# Patient Record
Sex: Female | Born: 1985 | Race: White | Hispanic: No | Marital: Single | Smoking: Never smoker
Health system: Southern US, Community
[De-identification: ages and names within clinical notes are randomized; demographics above are authoritative.]

## PROBLEM LIST (undated history)

## (undated) DIAGNOSIS — G809 Cerebral palsy, unspecified: Secondary | ICD-10-CM

## (undated) DIAGNOSIS — M62838 Other muscle spasm: Secondary | ICD-10-CM

## (undated) DIAGNOSIS — G8250 Quadriplegia, unspecified: Secondary | ICD-10-CM

## (undated) DIAGNOSIS — G709 Myoneural disorder, unspecified: Secondary | ICD-10-CM

## (undated) DIAGNOSIS — F32A Depression, unspecified: Secondary | ICD-10-CM

## (undated) HISTORY — PX: BACLOFEN PUMP IMPLANTATION: SHX330

## (undated) HISTORY — DX: Depression, unspecified: F32.A

## (undated) HISTORY — DX: Other muscle spasm: M62.838

## (undated) HISTORY — PX: BLADDER SURGERY: SHX569

## (undated) HISTORY — DX: Cerebral palsy, unspecified: G80.9

## (undated) HISTORY — DX: Myoneural disorder, unspecified: G70.9

## (undated) HISTORY — PX: CLOSURE, WOUND, FLAP: SHX3461

## (undated) HISTORY — DX: Quadriplegia, unspecified: G82.50

## (undated) HISTORY — PX: HIP SURGERY: SHX245

---
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
## 2003-06-11 ENCOUNTER — Ambulatory Visit (INDEPENDENT_AMBULATORY_CARE_PROVIDER_SITE_OTHER)
Admit: 2003-06-11 | Disposition: A | Discharge status: Home / Self Care | Payer: Self-pay | Source: Ambulatory Visit | Admitting: Neurological Surgery

## 2003-06-12 ENCOUNTER — Ambulatory Visit: Admission: AD | Admit: 2003-06-12 | Payer: Self-pay | Source: Ambulatory Visit | Admitting: Neurological Surgery

## 2003-08-19 ENCOUNTER — Ambulatory Visit
Admit: 2003-08-19 | Disposition: A | Discharge status: Home / Self Care | Payer: Self-pay | Source: Ambulatory Visit | Admitting: Neurological Surgery

## 2003-08-25 ENCOUNTER — Ambulatory Visit (INDEPENDENT_AMBULATORY_CARE_PROVIDER_SITE_OTHER)
Admit: 2003-08-25 | Disposition: A | Discharge status: Home / Self Care | Payer: Self-pay | Source: Ambulatory Visit | Admitting: Neurological Surgery

## 2003-08-25 ENCOUNTER — Ambulatory Visit
Admit: 2003-08-25 | Disposition: A | Discharge status: Home / Self Care | Payer: Self-pay | Source: Ambulatory Visit | Admitting: Neurological Surgery

## 2003-10-27 ENCOUNTER — Ambulatory Visit (INDEPENDENT_AMBULATORY_CARE_PROVIDER_SITE_OTHER)
Admit: 2003-10-27 | Disposition: A | Discharge status: Home / Self Care | Payer: Self-pay | Source: Ambulatory Visit | Admitting: Neurological Surgery

## 2004-01-20 ENCOUNTER — Ambulatory Visit (INDEPENDENT_AMBULATORY_CARE_PROVIDER_SITE_OTHER)
Admit: 2004-01-20 | Disposition: A | Discharge status: Home / Self Care | Payer: Self-pay | Source: Ambulatory Visit | Admitting: Neurological Surgery

## 2004-12-27 ENCOUNTER — Ambulatory Visit (INDEPENDENT_AMBULATORY_CARE_PROVIDER_SITE_OTHER)
Admit: 2004-12-27 | Disposition: A | Discharge status: Home / Self Care | Payer: Self-pay | Source: Ambulatory Visit | Admitting: Neurological Surgery

## 2009-08-17 ENCOUNTER — Ambulatory Visit
Admit: 2009-08-17 | Disposition: A | Discharge status: Home / Self Care | Payer: Self-pay | Source: Ambulatory Visit | Admitting: Undersea and Hyperbaric Medicine

## 2009-08-24 ENCOUNTER — Ambulatory Visit
Admit: 2009-08-24 | Disposition: A | Discharge status: Home / Self Care | Payer: Self-pay | Source: Ambulatory Visit | Admitting: Undersea and Hyperbaric Medicine

## 2009-09-02 ENCOUNTER — Ambulatory Visit
Admit: 2009-09-02 | Disposition: A | Discharge status: Home / Self Care | Payer: Self-pay | Source: Ambulatory Visit | Admitting: Undersea and Hyperbaric Medicine

## 2009-09-07 ENCOUNTER — Ambulatory Visit
Admit: 2009-09-07 | Disposition: A | Discharge status: Home / Self Care | Payer: Self-pay | Source: Ambulatory Visit | Admitting: Undersea and Hyperbaric Medicine

## 2009-09-14 ENCOUNTER — Ambulatory Visit
Admit: 2009-09-14 | Disposition: A | Discharge status: Home / Self Care | Payer: Self-pay | Source: Ambulatory Visit | Admitting: Undersea and Hyperbaric Medicine

## 2009-09-16 ENCOUNTER — Ambulatory Visit
Admit: 2009-09-16 | Disposition: A | Discharge status: Home / Self Care | Payer: Self-pay | Source: Ambulatory Visit | Admitting: Undersea and Hyperbaric Medicine

## 2009-09-28 ENCOUNTER — Ambulatory Visit
Admit: 2009-09-28 | Disposition: A | Discharge status: Home / Self Care | Payer: Self-pay | Source: Ambulatory Visit | Admitting: Undersea and Hyperbaric Medicine

## 2009-10-19 ENCOUNTER — Ambulatory Visit
Admit: 2009-10-19 | Disposition: A | Discharge status: Home / Self Care | Payer: Self-pay | Source: Ambulatory Visit | Admitting: Undersea and Hyperbaric Medicine

## 2009-11-09 ENCOUNTER — Ambulatory Visit
Admit: 2009-11-09 | Disposition: A | Discharge status: Home / Self Care | Payer: Self-pay | Source: Ambulatory Visit | Admitting: Undersea and Hyperbaric Medicine

## 2010-01-04 ENCOUNTER — Ambulatory Visit
Admit: 2010-01-04 | Disposition: A | Discharge status: Home / Self Care | Payer: Self-pay | Source: Ambulatory Visit | Admitting: Undersea and Hyperbaric Medicine

## 2010-01-26 ENCOUNTER — Ambulatory Visit
Admit: 2010-01-26 | Disposition: A | Discharge status: Home / Self Care | Payer: Self-pay | Source: Ambulatory Visit | Admitting: Undersea and Hyperbaric Medicine

## 2010-11-12 IMAGING — CR DG TOE GREAT 2+V*L*
3 series · 3 of 3 positions shown · non-contrast
Comparison: 03/24/2005

CLINICAL DATA: Left foot pain - question osteomyelitis of the left
great toe

LEFT TOE - 2+ VIEW

[w toes lateral left *]
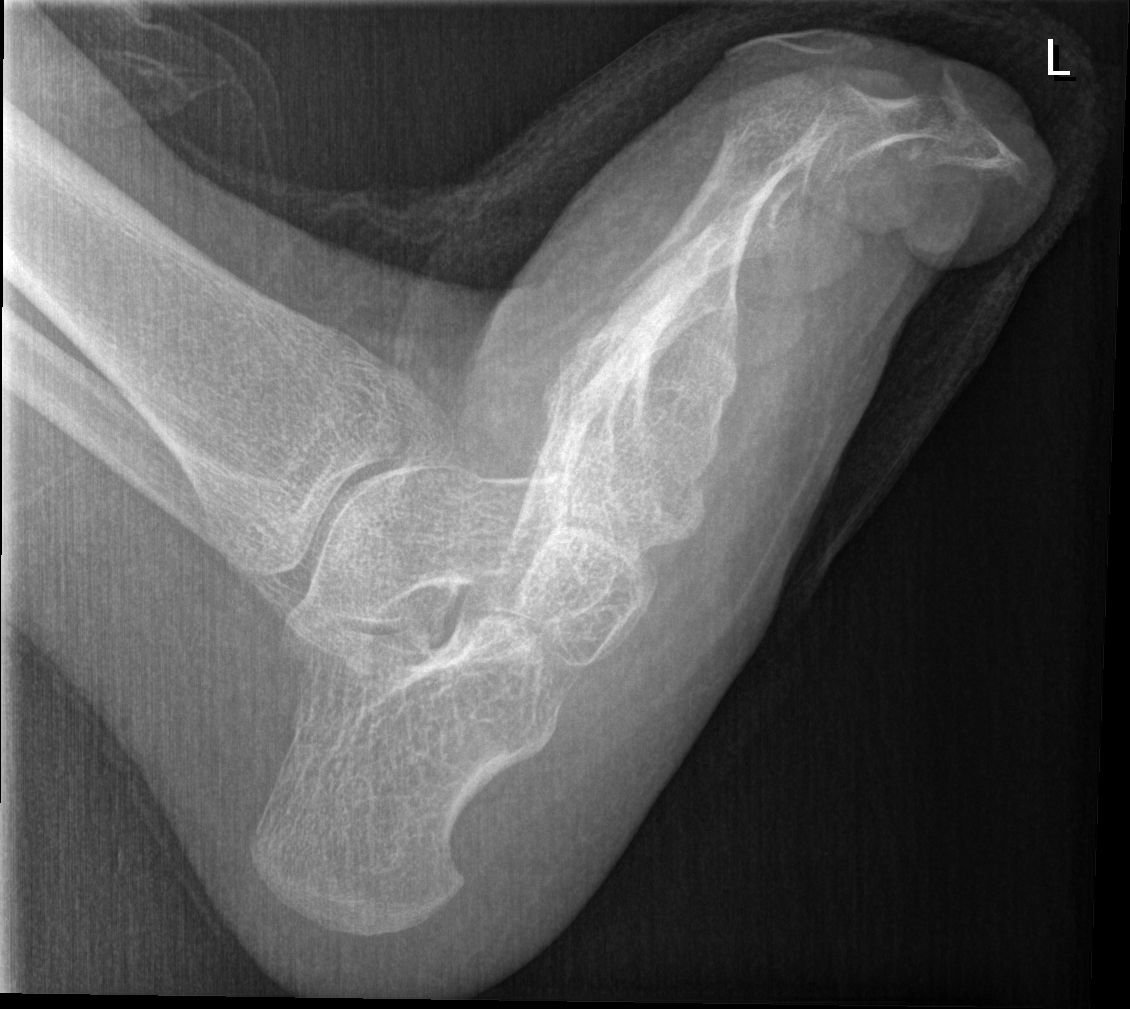

[view not recorded (1 of 2)]
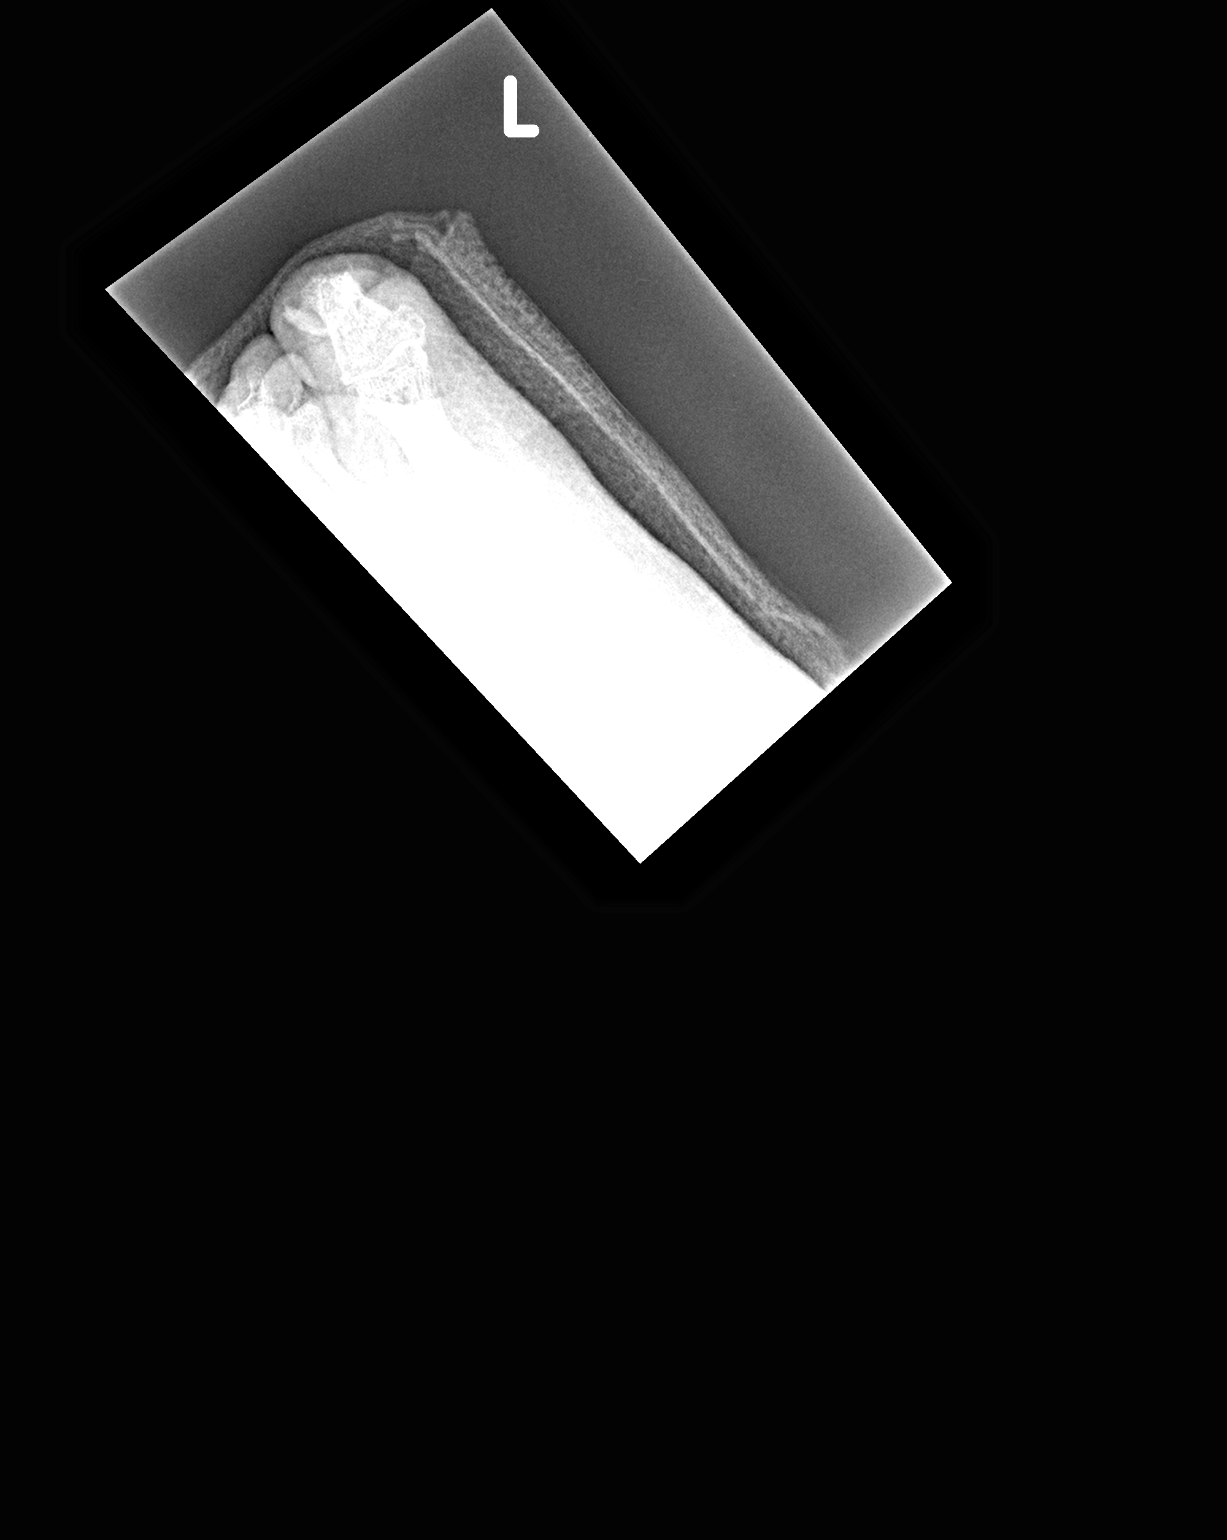

[view not recorded (2 of 2)]
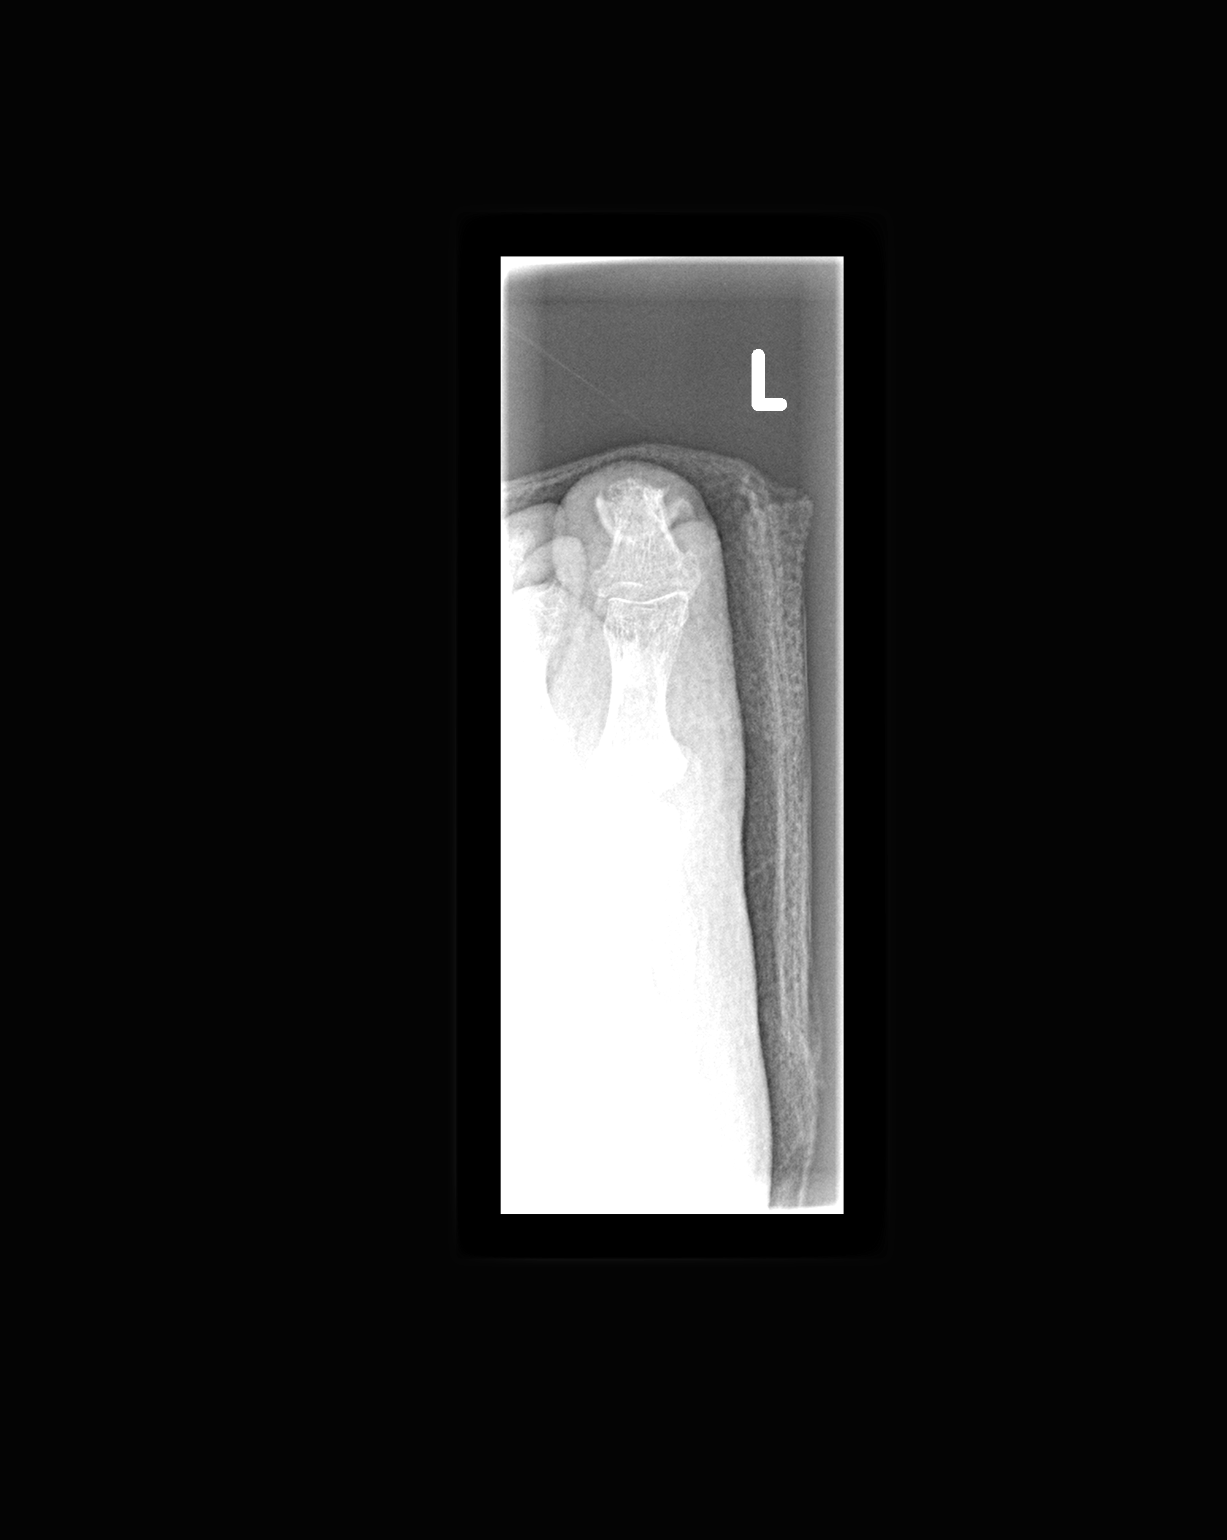

[3 of 3 positions shown; findings below may reference images not displayed]

FINDINGS: Bony detail is somewhat obscured by the bandaging
material.  Nonetheless, in the AP and oblique projections, there
does appear to be at least some degree of osteolysis involving the
terminal tuft of the great toe, compared to prior studies.  This is
suggestive of osteomyelitis.
IMPRESSION: Probable osteomyelitis involving the terminal tuft of the great
toe.

## 2011-02-22 ENCOUNTER — Encounter (INDEPENDENT_AMBULATORY_CARE_PROVIDER_SITE_OTHER): Payer: Self-pay

## 2011-02-22 ENCOUNTER — Ambulatory Visit (INDEPENDENT_AMBULATORY_CARE_PROVIDER_SITE_OTHER): Payer: Medicare Other | Admitting: Family Medicine

## 2011-02-22 DIAGNOSIS — N390 Urinary tract infection, site not specified: Secondary | ICD-10-CM

## 2011-03-01 ENCOUNTER — Ambulatory Visit: Admit: 2011-03-01 | Payer: Self-pay | Source: Ambulatory Visit

## 2011-03-21 ENCOUNTER — Ambulatory Visit (INDEPENDENT_AMBULATORY_CARE_PROVIDER_SITE_OTHER): Payer: Medicare Other

## 2011-03-21 DIAGNOSIS — N390 Urinary tract infection, site not specified: Secondary | ICD-10-CM

## 2011-03-21 DIAGNOSIS — R300 Dysuria: Secondary | ICD-10-CM

## 2011-03-21 LAB — POCT URINALYSIS DIPSTIX (10)(MULTI-TEST)
Bilirubin, UA POCT: NEGATIVE
Blood, UA POCT: NEGATIVE
Glucose, UA POCT: NEGATIVE mg/dL
Ketones, UA POCT: NEGATIVE mg/dL
Nitrite, UA POCT: POSITIVE — AB
POCT Spec Gravity, UA: 1.015 (ref 1.001–1.035)
POCT pH, UA: 6.5 (ref 5–8)
Protein, UA POCT: NEGATIVE mg/dL
Urobilinogen, UA: 0.2 mg/dL

## 2011-04-07 NOTE — Progress Notes (Unsigned)
Account Number: [id number]    *** PRELIMINARY DRAFT ***      Document ID: [id number]      Admit Date: 09/16/2009      Service Date: 09/16/2009            Patient Location: DISCHARGED 09/17/2009      Patient Type: O            PHYSICIAN/PROVIDER: Gwenyth Allegra MD                  HISTORY OF PRESENT ILLNESS:      The patient is a 25 year old woman who returns in followup for her left      first metatarsal head nonhealing wound.  She had a graft placed on August 24, 2009.  She returns today in followup with no complaints.            Since her last visit on September 07, 2009, she has had no change in      medications.  She has had no hospitalizations.  She has had no change to      her review of systems.            PHYSICAL EXAMINATION:      GENERAL:  The patient is a thin woman in no acute distress.      VITAL SIGNS:  Temperature is 96.6, pulse is 82, blood pressure is 104/66.      She rates her pain as a 0/10.      SKIN:  Her wound measures 0.2 cm in diameter.  There is a stable eschar at      the wound site.  There is no erythema or drainage.            ASSESSMENT AND PLAN:      I suspect the patient's eschar will slough within the next week or two.      Overall, she looks good and her measurements continue to decrease.  She      will follow up next week.            Please see the daily wound assessment and treatment form dated September 16, 2009, for further documentation of patient education and training as well      as her final status and destination.                        Electronic Signing Provider      _______________________________     Date/Time Signed: _____________      Gwenyth Allegra MD [phone])            D:  09/16/2009 13:55 PM by Dr. Gwenyth Allegra, MD [phone])      T:  09/18/2009 21:04 PM by [id number]          Everlean Cherry: [id number]) (Doc ID: [id number])                  NG:EXBM Zenaida Tesar MD

## 2011-04-07 NOTE — Progress Notes (Signed)
[name], [name]      MRN:          [id number]      Account:      [id number]      Document ID:  [id number] [id number] [id number]      Service Date: 09/02/2009            Admit Date: 09/02/2009            Patient Location: DISCHARGED 09/03/2009      Patient Type: O            PHYSICIAN/PROVIDER: Gwenyth Allegra MD                  LOCATION:      [facility]            HISTORY OF PRESENT ILLNESS:      The patient is a 25 year old woman who returns in followup for her      nonhealing left first metatarsal head dorsal wound.  She had an allograft      placed on August 24, 2009.  She returns today in followup.            Since her last visit on August 24, 2009, she has had no change in      medications.  She has had no hospitalizations.  She has had no change to      her review of systems.            PHYSICAL EXAMINATION:      VITAL SIGNS:  Today, temperature is 96.4, pulse is 80, blood pressure is      106/63.  She rates her pain as a 0/10.      SKIN:  Her allograft is adherent.  There is no erythema or drainage.  There      is no foul odor.            ASSESSMENT AND PLAN:      Overall, the patient is doing well a little over a week after her      allograft.  She will continue with a Xeroform dressing.  She will follow up      next week with Dr. Deno Lunger.            Please see the daily wound assessment and treatment form dated September 02, 2009, for further documentation of patient education and training as well      as her final status and destination.                        Electronic Signing Provider            D:  09/02/2009 19:16 PM by Dr. Gwenyth Allegra, MD [phone])      T:  09/04/2009 14:52 PM by [id number]                  SA:YTKZ Anthon Harpole MD                                   Page 1 of 1      Authenticated by Gwenyth Allegra, MD On 09/08/2009 11:41:15 AM

## 2011-04-07 NOTE — Progress Notes (Signed)
[name] [name], [name] [name]      MRN:          [id number]      Account:      [id number]      Document ID:  [id number] [phone]      Service Date: 08/17/2009            Admit Date: 08/17/2009            Patient Location: DISCHARGED 08/18/2009      Patient Type: Val Eagle            PHYSICIAN/PROVIDER: Marti Sleigh DO                  LOCATION:      [facility].            HISTORY OF PRESENT ILLNESS:      This is a 25 year old white female with a history of cerebral palsy who has      had a wound over the dorsum of her left first metatarsal head for almost 5      years.  She relates it started as a blister when she was wearing sandals in      2006 and developed into a nonhealing ulcer.  She was treated at [facility] in [facility] and was diagnosed having MRSA in      April 2010 treated with antibiotics.  Has been on multiple silver      preparations as wound treatment.  Has had periodic sharp debridement.  Most      recently has been applying gentamicin ointment to the wound daily.  Of      note, is that she sleeps on her abdomen and possibly causes additional      pressure to the wound because of her sleeping position.            PAST MEDICAL HISTORY:      Cerebral palsy, migraines, urinary incontinence.            PAST SURGICAL HISTORY:      Bladder augmentation with continent urinary diversion.            SOCIAL HISTORY:      She does not drink alcohol.  She does not smoke.  She is currently      unemployed, looking for work.            FAMILY HISTORY:      Her father had diabetes.  Her mother has high blood pressure.            REVIEW OF SYSTEMS:      As noted above.  She also has spasms secondary to cerebral palsy, and she      has had swelling in her lower extremities periodically.  Her feet are      always somewhat cyanotic and cold.  A full systems review did not reveal      any additional information.            DRUG ALLERGIES:      None known.                                          Page 1 of 3      BARABARA, MOTZ      MRN:  [id number]      Account:      [id number]      Document ID:  [id number]      Service Date: 08/17/2009            MEDICATIONS:      Neurontin 900 mg twice daily, Topamax 50 mg daily, Ditropan 10 mg b.i.d.,      Prozac 20 mg daily, Macrodantin 50 mg daily, Lyrica 75 mg daily, Colace 200      mg daily, Align (probiotic) once daily, multivitamin daily, vitamin D 2000      daily, baclofen SQ with implantable pump.            A nutrition screen is scored at 9, which puts her at high risk for poor      wound healing.            PHYSICAL EXAMINATION:      GENERAL:  She is alert and oriented, in no acute distress.  She denies any      pain.      VITAL SIGNS:  Temperature is 97.2, blood pressure 132/71, pulse 88.      EXTREMITIES:  On exam of her feet, her right foot appears somewhat      edematous and deformed.  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  The pulses are intact.  On      her left foot, she has onychomycosis of her toes.  The toenails were      trimmed today.  Her foot is cold to the touch but pulses are intact.  She      has a superficial ulcer over her left first metatarsal head measuring 0.5 x      0.4 and 0.2-cm deep.  If this was classified as a pressure ulcer, it would      be a stage II.  There is a moderate amount of fibrin in the wound.  There      is firm, red granulation tissue present.  There is no exposed bone or      tendon, no eschar or odor.  There is a small amount of yellowish discharge.       Periwound area was intact.  The wound was anesthetized with 2% topical      lidocaine, excisionally debrided to the subcutaneous tissue level with a      sharp curette, causing a minimal amount of bleeding.  Debridement was done      to remove all necrotic debris, fibrin, and decrease the bacterial load.  A      second curette was used to obtain a wound culture.  The wound was  dressed      with Acticoat 7 Flex and Kling dressing.  An offloading foam pad was placed in      the wound.            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  She has been wearing soft      fluffy house shoes to help prevent pressure and further problems.            PLAN:  1.  A wound culture is pending.      2.  The patient would benefit from an allograft to assist with wound      healing with the wound having persisted almost 5 years, coming this May      2011.  It is necessary to use advanced wound healing products to heal this      wound expeditiously.      3.  The patient was advised of the need to offload her wound and to follow      up in 1 week for further care.                                   Page 2 of 3      JAMILAH, JEAN      MRN:          [id number]      Account:      [id number]      Document ID:  [id number] [phone]      Service Date: 08/17/2009                              Electronic Signing Provider            D:  08/17/2009 13:48 PM by Dr. Marti Sleigh, DO [phone])      T:  08/18/2009 16:17 PM by [id number]                  cc:     Andris Flurry DO                                   Page 3 of 3      Authenticated and Edited by Marti Sleigh, DO On 08/26/09 10:01:39 AM

## 2011-04-07 NOTE — Progress Notes (Signed)
[name] [name], [name] [name]      MRN:          [id number]      Account:      [id number]      Document ID:  [id number]      Service Date: 09/28/2009            Admit Date: 09/28/2009            Patient Location: DISCHARGED 09/29/2009      Patient Type: Val Eagle            PHYSICIAN/PROVIDER: Marti Sleigh DO                  LOCATION:      [facility].            HISTORY OF PRESENT ILLNESS:      This is a 25 year old woman who is returning for care of a pressure ulcer      over the dorsum of her left first metatarsal.            PHYSICAL EXAMINATION:      Her temperature is 96.5.  She denies any pain.  Her wound today is covered      with a firmly adherent eschar measuring 0.3 x 0.3.  The eschar was left      intact.  A black foam offloading dressing was placed over the wound and      held in place with cotton and Coban.            PLAN:      The patient tolerated procedure well and will return in 2 weeks for      followup care.                        Electronic Signing Provider            D:  09/28/2009 11:42 AM by Dr. Marti Sleigh, DO [phone])      T:  09/28/2009 14:54 PM by [id number]                  YQ:[email or web address] Deno Lunger DO                                   Page 1 of 1      Authenticated by Marti Sleigh, DO On 09/30/2009 10:36:03 AM

## 2011-04-07 NOTE — Progress Notes (Signed)
[name] [name], [name] [name]      MRN:          [id number]      Account:      [id number]      Document ID:  [id number] [phone]      Service Date: 08/24/2009            Admit Date: 08/24/2009            Patient Location: DISCHARGED 08/25/2009      Patient Type: Val Eagle            PHYSICIAN/PROVIDER: Marti Sleigh DO                  LOCATION:      [facility].            HISTORY OF PRESENT ILLNESS:      This is a 25 year old white female who has cerebral palsy and has had a      chronic wound over the dorsum of her left first metatarsal head for      approximately 5 years.  She has had advanced wound healing at her previous      wound clinic.  Has been seen here last week when a wound culture was done.      The culture and showed no growth and the patient is returning today for      further advanced wound healing treatment.            PHYSICAL EXAMINATION:      VITAL SIGNS:  Temperature is 96.1.  She denies any pain.      SKIN:  She has a wound over her left first metatarsal head measuring 0.5 cm      x 0.3 cm and 0.2 cm deep.  There is undermining of 0.1 cm.  All around the      wound.  There is mild amount of fibrin over the wound with firm, pink      granulation tissue present.  There is no exposed bone or tendon.  No eschar      or odor.  There was a small amount of yellowish-pink drainage.  The      periwound area was intact.            The wound was anesthetized with 2% topical lidocaine, excisionally debrided      with a sharp curette to the subcutaneous tissue level.  No bleeding      occurred.  It was further prepped for allograft placement in the usual      manner.  After prepping and draping the wound, a frozen allograft that was      thawed according to protocol was measured and cut to fit over the wound.      It was held in place with Alcare and sterile tape closures, covered with      Xeroform gauze and a calcium alginate dressing was placed above it.  The      patient tolerated the procedure well.             IMPRESSION:      Wound on dorsal aspect of the left first metatarsal for 5 years, now      treated with an allograft.            PLAN:      The patient is to continue offloading her foot as much as possible and      return in 1 week for  further care.                                         Page 1 of 2      [name] [name], [name] [name]      MRN:          [id number]      Account:      [id number]      Document ID:  [id number]      Service Date: 08/24/2009                        Electronic Signing Provider            D:  08/24/2009 13:52 PM by Dr. Marti Sleigh, DO [phone])      T:  08/24/2009 23:55 PM by [id number]                  NG:[email or web address] Deno Lunger DO                                   Page 2 of 2      Authenticated by Marti Sleigh, DO On 08/26/2009 10:00:21 AM

## 2011-04-07 NOTE — Progress Notes (Signed)
MONAY, HOULTON      MRN:          [id number]      Account:      [id number]      Document ID:  [id number]      Service Date: 01/26/2010            Admit Date: 01/26/2010            Patient Location: DISCHARGED 01/27/2010      Patient Type: O            PHYSICIAN/PROVIDER: Hiram Gash DPM                  REFERRING PHYSICIAN:      Dr. Andris Flurry.            REASON FOR REFERRAL:      Ulceration to the first MPJ, left foot.            HISTORY OF PRESENT ILLNESS:      This is a 25 year old female with past medical history of cerebral palsy,      paraplegia, migraines, urinary incontinence, who presents to [facility] wound healing center for evaluation of ulceration on the      first MPJ, left foot.  Patient is nonweightbearing secondary to paraplegia      from cerebral palsy.  Patient has developed an ulceration to her first MPJ      left foot secondary to shoe wear.  Mother is with patient and reports that      she has been battling ulcerations to her left foot for the past 6 years,      ever since they have been actively applying shoes to the patient.  Mother      reports that they are interested in forms of shoewear that can be more      accommodating to the patient so ulcerations wouldn't develop.  Patient reports      no nausea, vomiting, fever or chills.            PAST MEDICAL HISTORY:      As above.            PAST SURGICAL HISTORY:      Bladder augmentation.            MEDICATIONS:      Neurontin 900 mg twice daily, Topamax 50 mg once daily, Ditropan 10 mg      twice daily, Prozac 20 mg once daily, Macrodantin 50 mg once daily, Lyrica      75 mg daily, Colace 200 mg once daily, Align once daily, multivitamin once      daily, vitamin D 200 international units daily.            ALLERGIES:      No known drug allergies.            SOCIAL HISTORY:      Negative alcohol, negative tobacco.                                         Page 1 of 3      RANYA, FIDDLER      MRN:           [id number]      Account:      [id number]      Document  ID:  [id number]      Service Date: 01/26/2010            REVIEW OF SYSTEMS:      Positive neurologic with cerebral palsy.  Positive for renal and      genitourinary with bladder augmentation, positive musculoskeletal with      paraplegia.            PHYSICAL EXAMINATION:      VITAL SIGNS:  Current temperature 97.5, pulse 87, blood pressure 129/74.      LOWER EXTREMITY PHYSICAL EXAM:      VASCULAR: Palpable pedal pulses.      NEUROLOGIC: Positive sensation of bilateral feet.      DERMATOLOGIC:  The patient      has an eschar overlying the dorsal medial first MPJ of the left foot measuring      0.7 cm in length x 1 cm in width with no erthema or drainage. Small eschar to      distal tuft left Hallux with no erythema or drainage.      ORTHOPEDIC: The left      hallux is in the plantarflex position secondary to semirigid contracture.            ASSESSMENT:      1.  Ulcer, left foot.      2.  Plantarflexed hallux, left foot      3.  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            PLAN:      Informed mother and patient that she has no acute signs of infection to the      left foot.  Informed mother and patient that the reason for the chronic      ulcerations left Hallux are due to the hallux contracture; therefore, whenever      any shoe is placed on her foot, it is going to cause friction and eventual      ulceration. Informed mother and patient that one way to alleviate the chronic      ulcers is for patient not to wear closed toe shoes, but mother and patient      would prefer shoewear.  I recommended surgical intervention. I explained to the      mother and patient that there are 2 procedures that can be done, one being a      flexor tenotomy to flexor hallucis longus as a simple procedure, and the other      would be a first MPJ fusion.  Informed mother and patient that a first MPJ      fusion would require about 6 weeks minimum for bone healing and  potential      complications are increased such as infection, delayed wound healing, nonunion      or delayed union, and malposition. Informed mother and patient that with flexor      tenotomy there are minimal complications and it would allow for the flexor      contracture to the left hallux to be resolved.  Informed patient that there is      a chance that in the future the flexor contracture could come back, but not as      bad as it is now.            After discussion of possible procedures, the mother and patient seem to      favor a flexor tenotomy to be done in the future.  I  informed patient that      they can seek a second opinion in reference to surgical intervention to the      left foot.  Informed mother and patient that in the meantime, I will      recommend that she does not wear any shoes and that she wear socks which      can allow for the eschar that she has to her left foot to resolve.      Informed patient that they can follow up as needed for any further issues                                   Page 2 of 3      DARTHA, ROZZELL      MRN:          [id number]      Account:      [id number]      Document ID:  [id number]      Service Date: 01/26/2010            to the left foot.                                    D:  02/09/2010 09:07 AM by Dr. Miguel Dibble. Alanda Slim, DPM [phone])      T:  02/09/2010 10:16 AM by [id number]                  cc:                                   Page 3 of 3      Authenticated and Edited by Hiram Gash, DPM On 02/09/10 1:00:54 PM

## 2011-04-07 NOTE — Progress Notes (Signed)
[name] [name], [name] [name]      MRN:          [id number]      Account:      [id number]      Document ID:  [id number]      Service Date: 10/19/2009            Admit Date: 10/19/2009            Patient Location: DISCHARGED 10/20/2009      Patient Type: Val Eagle            PHYSICIAN/PROVIDER: Marti Sleigh DO                  LOCATION:      [facility].            HISTORY OF PRESENT ILLNESS:      This is a 25 year old white female who has cerebral palsy and has a      pressure ulcer over her left first metatarsal head.  She denies any pain.            PHYSICAL EXAMINATION:      VITAL SIGNS:  Temperature is 96.3.  On exam, there is a small firmly      adherent eschar over the wound site.  It was not easily removed with blunt      debridements, so was left intact.  A dry dressing and offloading foam was      placed over the wound area.  The patient tolerated the procedure well.            IMPRESSION:      Pressure ulcer over the dorsum of left first metatarsal head covered with      eschar.            PLAN:      To leave the eschar intact spontaneously falls off.  We are hoping for intact       skin and a healed wound after escars falls off.                        Electronic Signing Provider            D:  10/19/2009 13:55 PM by Dr. Marti Sleigh, DO [phone])      T:  10/19/2009 18:37 PM by [id number]                  cc:     Andris Flurry DO                                   Page 1 of 1      Authenticated and Edited by Marti Sleigh, DO On 10/21/09 10:29:13 AM

## 2011-04-07 NOTE — Progress Notes (Signed)
[name], [name]      MRN:          [id number]      Account:      [id number]      Document ID:  [id number]      Service Date: 11/09/2009            Admit Date: 11/09/2009            Patient Location: DISCHARGED 11/10/2009      Patient Type: O            PHYSICIAN/PROVIDER: Marti Sleigh DO                  HISTORY OF PRESENT ILLNESS:      This is a 25 year old woman who is following up for a pressure ulcer over      her dorsal aspect of her left first metatarsal head.            PHYSICAL EXAMINATION:      VITAL SIGNS:  Temperature is 96.5.  She denies any pain.      EXTREMITIES:  There is an eschar firmly adherent over the wound site.            DESCRIPTION OF PROCEDURE:      The eschar was bluntly removed. No excisional debridement was done.  It was      then redressed with an offloading foam to prevent pressure to the wound and      reduce the chances of recurrence of a wound over that site.  Kling and a      Coban wrap were used to keep the offloading foam in place.            IMPRESSION:      Pressure ulcer over left first metatarsal head, now healed.            PLAN:      The patient tolerated the procedure well.  She was instructed to prevent      trauma to the wound area and prevent any pressure.  She will be wearing      offloading foam over the site.  She has plans to obtain special shoes from      a podiatrist to allow for better offloading of the area.  She would like to      return in 4 weeks for a reevaluation.                        Electronic Signing Provider            D:  11/11/2009 16:00 PM by Dr. Marti Sleigh, DO [phone])      T:  11/13/2009 17:09 PM by [id number]                  cc:                                   Page 1 of 1      Authenticated by Marti Sleigh, DO On 11/16/2009 09:04:44 AM

## 2011-04-07 NOTE — Progress Notes (Signed)
[name] [name], [name] [name]      MRN:          [id number]      Account:      [id number]      Document ID:  [id number]      Service Date: 09/07/2009            Admit Date: 09/07/2009            Patient Location: DISCHARGED 09/08/2009      Patient Type: O            PHYSICIAN/PROVIDER: Gwenlyn Fudge MD                  LOCATION:      [facility]            HISTORY OF PRESENT ILLNESS:      The patient is a 25 year old with a left foot wound who returns for      regularly scheduled evaluation with her mother.  There are no problems to      relate today.            PHYSICAL EXAMINATION:      VITAL SIGNS:  Temperature is 95, pulse 72, blood pressure 113/72.      EXTREMITIES:  She has a small wound over the dorsum of first metatarsal      head measuring 0.3 x 0.3 cm.  There is a small fragment of adherent      allograft.  There is firm, red granulation tissue at the base of the wound.       There is slightly macerated periwound skin.            DESCRIPTION OF PROCEDURE:      After topical 2% was applied to the wound, skin and subcutaneous tissue      debridement of the small wound was performed with a sharp curette.      Bleeding was controlled with digital pressure.  Patient tolerated this      well.            IMPRESSION:      The patient is a 25 year old with a small wound over the dorsum of her      first metatarsal head.  This was dressed with Oasis, Promogran, and covered      with Restore AG and topical foam.  She is to return in a week or earlier      should any problems arise.                        Electronic Signing Provider            D:  09/07/2009 11:27 AM by Dr. Ocie Bob. Margo Aye, MD ([id number])      T:  09/08/2009 12:31 PM by [id number]                                                     Page 1 of 1      Authenticated and Edited by Gwenlyn Fudge, MD On 09/10/09 8:46:46 AM

## 2011-04-07 NOTE — Progress Notes (Signed)
CALEA, HRIBAR      MRN:          [id number]      Account:      [id number]      Document ID:  [id number]      Service Date: 01/04/2010            Admit Date: 01/04/2010            Patient Location: DISCHARGED 01/05/2010      Patient Type: Val Eagle            PHYSICIAN/PROVIDER: Marti Sleigh DO                  LOCATION:      [facility].            HISTORY OF PRESENT ILLNESS:      This is a 25 year old woman with cerebral palsy who is returning for a      wound over her dorsal left first metatarsal head.  She has had pressure      ulcers in the site before, especially since she sleeps on her abdomen.  She      has been using offloading foam over the wound site.            PHYSICAL EXAMINATION:      VITAL SIGNS:  Temperature is 97.7.  She denies any pain.      SKIN:  She has an eschar measuring 0.9 x 0.8 cm over the left first      metatarsal head.  The eschar was moistened with 2% topical lidocaine and      excisionally debrided using a sharp curette to the partial thickness level.       No bleeding occurred.  Following debridement of the eschar, the wound site      measured 0.1 cm x 0.1 cm and 0.1 cm deep, which is a very small punctate      lesion secondary to pressure.            IMPRESSION:      Pressure ulcer, stage II, of left great toe.            PLAN:      The patient was prescribed Aquacel AG to place over the wound and to change      every 2 to 3 days.  She was rewrapped with offloading foam, Coban and      cotton wrap.  The patient will follow up with a podiatrist, Dr. Alanda Slim,      next week.                        Electronic Signing Provider            D:  01/04/2010 15:50 PM by Dr. Marti Sleigh, DO [phone])      T:  01/05/2010 15:48 PM by [id number]                  cc:                                   Page 1 of 1      Authenticated by Marti Sleigh, DO On 01/06/2010 09:58:48 AM

## 2011-05-09 ENCOUNTER — Ambulatory Visit
Admit: 2011-05-09 | Discharge: 2011-05-09 | Disposition: A | Discharge status: Home / Self Care | Payer: Self-pay | Source: Ambulatory Visit

## 2011-05-23 ENCOUNTER — Ambulatory Visit
Admit: 2011-05-23 | Discharge: 2011-05-23 | Disposition: A | Discharge status: Home / Self Care | Payer: Self-pay | Source: Ambulatory Visit

## 2011-05-23 LAB — CBC AND DIFFERENTIAL
Basophils Absolute Automated: 0.03 10*3/uL (ref 0.00–0.20)
Basophils Automated: 1 % (ref 0–2)
Eosinophils Absolute Automated: 0.1 10*3/uL (ref 0.00–0.70)
Eosinophils Automated: 2 % (ref 0–5)
Hematocrit: 41.2 % (ref 37.0–47.0)
Hgb: 13.7 g/dL (ref 12.0–16.0)
Immature Granulocytes Absolute: 0 10*3/uL
Immature Granulocytes: 0 % (ref 0–1)
Lymphocytes Absolute Automated: 1.93 10*3/uL (ref 0.50–4.40)
Lymphocytes Automated: 42 % — ABNORMAL HIGH (ref 15–41)
MCH: 30 pg (ref 28.0–32.0)
MCHC: 33.3 g/dL (ref 32.0–36.0)
MCV: 90.4 fL (ref 80.0–100.0)
MPV: 11 fL (ref 9.4–12.3)
Monocytes Absolute Automated: 0.24 10*3/uL (ref 0.00–1.20)
Monocytes: 5 % (ref 0–11)
Neutrophils Absolute: 2.31 10*3/uL (ref 1.80–8.10)
Neutrophils: 50 % — ABNORMAL LOW (ref 52–75)
Nucleated RBC: 0 /100 WBC
Platelets: 261 10*3/uL (ref 140–400)
RBC: 4.56 10*6/uL (ref 4.20–5.40)
RDW: 14 % (ref 12–15)
WBC: 4.61 10*3/uL (ref 3.50–10.80)

## 2011-05-23 LAB — GFR: EGFR: >60

## 2011-05-23 LAB — PREALBUMIN: Prealbumin: 20 mg/dL (ref 16.0–38.0)

## 2011-05-23 LAB — COMPREHENSIVE METABOLIC PANEL
ALT: 21 U/L (ref 0–55)
AST (SGOT): 20 U/L (ref 5–34)
Albumin/Globulin Ratio: 1.2 (ref 0.9–2.2)
Albumin: 4.1 g/dL (ref 3.5–5.0)
Alkaline Phosphatase: 65 U/L (ref 40–150)
BUN: 10 mg/dL (ref 6.0–20.0)
Bilirubin, Total: 0.2 mg/dL (ref 0.1–1.2)
CO2: 23 mEq/L (ref 21–30)
Calcium: 9.2 mg/dL (ref 8.5–10.5)
Chloride: 103 mEq/L (ref 96–109)
Creatinine: 0.5 mg/dL (ref 0.4–1.5)
Globulin: 3.3 g/dL (ref 2.0–3.7)
Glucose: 81 mg/dL (ref 70–100)
Potassium: 3.8 mEq/L (ref 3.5–5.3)
Protein, Total: 7.4 g/dL (ref 6.0–8.3)
Sodium: 138 mEq/L (ref 135–146)

## 2011-05-23 LAB — SEDIMENTATION RATE: Sed Rate: 7 mm/Hr (ref 0–20)

## 2011-05-23 LAB — HEMOLYSIS INDEX: Hemolysis Index: 9 Index (ref 0–9)

## 2011-05-23 LAB — C-REACTIVE PROTEIN HIGH SENSITIVE: C-Reactive Protein, High Sensitive: 0.02 mg/dL (ref 0.00–0.50)

## 2011-06-07 ENCOUNTER — Ambulatory Visit
Admit: 2011-06-07 | Discharge: 2011-06-07 | Disposition: A | Discharge status: Home / Self Care | Payer: Self-pay | Source: Ambulatory Visit

## 2011-06-21 ENCOUNTER — Ambulatory Visit
Admit: 2011-06-21 | Discharge: 2011-06-21 | Disposition: A | Discharge status: Home / Self Care | Payer: Self-pay | Source: Ambulatory Visit

## 2011-06-23 ENCOUNTER — Other Ambulatory Visit (INDEPENDENT_AMBULATORY_CARE_PROVIDER_SITE_OTHER): Payer: Self-pay

## 2011-06-23 MED ORDER — FLUOXETINE HCL 20 MG PO CAPS
20.00 mg | ORAL_CAPSULE | Freq: Every day | ORAL | Status: DC
Start: 2011-06-23 — End: 2011-12-05

## 2011-06-28 ENCOUNTER — Ambulatory Visit
Admit: 2011-06-28 | Discharge: 2011-06-28 | Disposition: A | Discharge status: Home / Self Care | Payer: Self-pay | Source: Ambulatory Visit

## 2011-07-05 ENCOUNTER — Ambulatory Visit
Admit: 2011-07-05 | Discharge: 2011-07-05 | Disposition: A | Discharge status: Home / Self Care | Payer: Self-pay | Source: Ambulatory Visit

## 2011-07-12 ENCOUNTER — Ambulatory Visit
Admit: 2011-07-12 | Discharge: 2011-07-12 | Disposition: A | Discharge status: Home / Self Care | Payer: Self-pay | Source: Ambulatory Visit

## 2011-07-19 ENCOUNTER — Ambulatory Visit
Admit: 2011-07-19 | Discharge: 2011-07-19 | Disposition: A | Discharge status: Home / Self Care | Payer: Self-pay | Source: Ambulatory Visit

## 2011-07-26 ENCOUNTER — Ambulatory Visit
Admit: 2011-07-26 | Discharge: 2011-07-26 | Disposition: A | Discharge status: Home / Self Care | Payer: Self-pay | Source: Ambulatory Visit

## 2011-08-02 ENCOUNTER — Ambulatory Visit
Admit: 2011-08-02 | Discharge: 2011-08-02 | Disposition: A | Discharge status: Home / Self Care | Payer: Self-pay | Source: Ambulatory Visit

## 2011-08-09 ENCOUNTER — Ambulatory Visit
Admit: 2011-08-09 | Discharge: 2011-08-09 | Disposition: A | Discharge status: Home / Self Care | Payer: Self-pay | Source: Ambulatory Visit

## 2011-08-30 ENCOUNTER — Ambulatory Visit
Admit: 2011-08-30 | Discharge: 2011-08-30 | Disposition: A | Discharge status: Home / Self Care | Payer: Self-pay | Source: Ambulatory Visit

## 2011-09-13 ENCOUNTER — Ambulatory Visit
Admit: 2011-09-13 | Discharge: 2011-09-13 | Disposition: A | Discharge status: Home / Self Care | Payer: Self-pay | Source: Ambulatory Visit

## 2011-09-13 ENCOUNTER — Encounter (INDEPENDENT_AMBULATORY_CARE_PROVIDER_SITE_OTHER): Payer: Self-pay | Admitting: Family Medicine

## 2011-09-13 ENCOUNTER — Ambulatory Visit (INDEPENDENT_AMBULATORY_CARE_PROVIDER_SITE_OTHER): Payer: Medicare Other | Admitting: Family Medicine

## 2011-09-13 ENCOUNTER — Encounter (INDEPENDENT_AMBULATORY_CARE_PROVIDER_SITE_OTHER): Payer: Self-pay

## 2011-09-13 VITALS — BP 113/83 | HR 93

## 2011-09-13 DIAGNOSIS — G801 Spastic diplegic cerebral palsy: Secondary | ICD-10-CM

## 2011-09-13 DIAGNOSIS — G8250 Quadriplegia, unspecified: Secondary | ICD-10-CM

## 2011-09-13 DIAGNOSIS — G808 Other cerebral palsy: Secondary | ICD-10-CM

## 2011-09-13 NOTE — Progress Notes (Signed)
Subjective:       Patient ID: [name] [name] is a 26 y.o. female.    HPI  This is 26 y/o F with hx of cerebral palsy as well as congential quadriplegia here with her mom. Pt states she has been having persisting spasm of lower extremities as well as back pain due to scoliosis. Pt is needing form filled for new wheel chair with increased mobility as well as bath system which help her to take shower/bath with ease. Also needing new hoyst to lift her from bed and out of chair.  Pt denies any decub ulcers or acute pain at this time. Pt has been taking Lyrica as well as Neurontin for neuropathic pain.   The following portions of the patient's history were reviewed and updated as appropriate: allergies, current medications, past family history, past medical history, past social history, past surgical history and problem list.    Review of Systems  No acute fatigue, no significiant weight changes. Pt denies HA/visual changes/dizziness. No chest pain/SOB/palpitation symptoms. Denies any GI changes. No acute neurologic changes.         Objective:    Physical Exam  BP 113/83  Pulse 93  LMP 08/18/2011  NAD. Sitting in electric wheel chair. Able to have meaningful conversation.  CV: RRR, no murmur or gallops. Resp: CTAB, no rhonchi or crackles. Abdom: Soft, NT/ND. Positive BS. No masses. Ext: no edema. Neuro: Grossly intact.         Assessment:       Encounter Diagnoses   Name Primary?   . Cerebral palsy Yes   . Quadriplegia            Plan:       1. Home equipment form filled and fax to agency and scanned into system.   2. Will continue current medication without changes.   3. Return in 3-6 months for follow up.     I have spent 25 minutes during this visit, more than half of visit spent on patient education and instructions.     Current outpatient prescriptions:docusate sodium (COLACE) 100 MG capsule, Take 200 mg by mouth 2 (two) times daily., Disp: , Rfl: ;  FLUoxetine (PROZAC) 20 MG capsule, Take 1 capsule (20 mg  total) by mouth daily., Disp: 90 capsule, Rfl: 1;  gabapentin (NEURONTIN) 600 MG tablet, Take 300 mg by mouth 2 (two) times daily. 3 pills 3 times daily, Disp: , Rfl: ;  Multiple Vitamin (MULTIVITAMIN) capsule, Take 1 capsule by mouth daily., Disp: , Rfl:   oxybutynin (DITROPAN-XL) 10 MG 24 hr tablet, Take 10 mg by mouth daily., Disp: , Rfl: ;  pregabalin (LYRICA) 75 MG capsule, Take 75 mg by mouth daily., Disp: , Rfl: ;  Probiotic Product (ALIGN) 4 MG CAPS, Take by mouth., Disp: , Rfl: ;  topiramate (TOPAMAX) 50 MG tablet, Take 50 mg by mouth daily., Disp: , Rfl: ;  VITAMIN D, CHOLECALCIFEROL, PO, Take 2,000 Unit by mouth daily., Disp: , Rfl:

## 2011-09-14 ENCOUNTER — Encounter (INDEPENDENT_AMBULATORY_CARE_PROVIDER_SITE_OTHER): Payer: Self-pay

## 2011-09-20 ENCOUNTER — Ambulatory Visit
Admit: 2011-09-20 | Discharge: 2011-09-20 | Disposition: A | Discharge status: Home / Self Care | Payer: Self-pay | Source: Ambulatory Visit

## 2011-10-09 ENCOUNTER — Ambulatory Visit (INDEPENDENT_AMBULATORY_CARE_PROVIDER_SITE_OTHER): Payer: Medicare Other | Admitting: Family Medicine

## 2011-10-09 ENCOUNTER — Encounter (INDEPENDENT_AMBULATORY_CARE_PROVIDER_SITE_OTHER): Payer: Self-pay | Admitting: Family Medicine

## 2011-10-09 VITALS — BP 114/79 | HR 98

## 2011-10-09 DIAGNOSIS — R300 Dysuria: Secondary | ICD-10-CM

## 2011-10-09 LAB — POCT URINALYSIS DIPSTIX (10)(MULTI-TEST)
Bilirubin, UA POCT: NEGATIVE
Blood, UA POCT: NEGATIVE
Glucose, UA POCT: NEGATIVE mg/dL
Ketones, UA POCT: NEGATIVE mg/dL
Nitrite, UA POCT: POSITIVE — AB
POCT Spec Gravity, UA: 1.02 (ref 1.001–1.035)
POCT pH, UA: 7 (ref 5–8)
Protein, UA POCT: NEGATIVE mg/dL
Urobilinogen, UA: 0.2 mg/dL

## 2011-10-09 MED ORDER — PHENAZOPYRIDINE HCL 100 MG PO TABS
100.0000 mg | ORAL_TABLET | Freq: Three times a day (TID) | ORAL | Status: AC | PRN
Start: 2011-10-09 — End: 2011-10-12

## 2011-10-09 MED ORDER — NITROFURANTOIN MONOHYD MACRO 100 MG PO CAPS
100.00 mg | ORAL_CAPSULE | Freq: Two times a day (BID) | ORAL | Status: AC
Start: 2011-10-09 — End: 2011-10-16

## 2011-10-09 NOTE — Progress Notes (Signed)
Subjective:       Patient ID: [name] [name] is a 26 y.o. female.    HPI  This is 26 y/o F with hx of Qadriplegia here for having recurrent UTI sx. Having burning sensation as well as bladder pressure.   No fever/chills. Has been self-cathing daily, been having more mucus discharge from catheter. No back pain. No fever/chills.      The following portions of the patient's history were reviewed and updated as appropriate:   No Known Allergies    Past Medical History   Diagnosis Date   . Depression    . Muscle spasm    . Neuromuscular disorder    . Cerebral palsy    . Quadriplegia congenital       History     Social History   . Marital Status: Single     Spouse Name: N/A     Number of Children: N/A   . Years of Education: N/A     Occupational History   . Not on file.     Social History Main Topics   . Smoking status: Never Smoker    . Smokeless tobacco: Never Used   . Alcohol Use: No   . Drug Use: No   . Sexually Active:      Other Topics Concern   . Not on file     Social History Narrative   . No narrative on file       Past Surgical History   Procedure Date   . Hip surgery    . Bladder surgery        Patient Active Problem List   Diagnosis   . Cerebral palsy with spastic/ataxic diplegia   . Quadriplegia       Current outpatient prescriptions:docusate sodium (COLACE) 100 MG capsule, Take 200 mg by mouth 2 (two) times daily., Disp: , Rfl: ;  FLUoxetine (PROZAC) 20 MG capsule, Take 1 capsule (20 mg total) by mouth daily., Disp: 90 capsule, Rfl: 1;  gabapentin (NEURONTIN) 600 MG tablet, Take 300 mg by mouth 2 (two) times daily. 3 pills 3 times daily, Disp: , Rfl: ;  Multiple Vitamin (MULTIVITAMIN) capsule, Take 1 capsule by mouth daily., Disp: , Rfl:   oxybutynin (DITROPAN-XL) 10 MG 24 hr tablet, Take 10 mg by mouth daily., Disp: , Rfl: ;  pregabalin (LYRICA) 75 MG capsule, Take 75 mg by mouth daily., Disp: , Rfl: ;  Probiotic Product (ALIGN) 4 MG CAPS, Take by mouth., Disp: , Rfl: ;  topiramate (TOPAMAX) 50 MG  tablet, Take 50 mg by mouth daily., Disp: , Rfl: ;  VITAMIN D, CHOLECALCIFEROL, PO, Take 2,000 Unit by mouth daily., Disp: , Rfl:   nitrofurantoin, macrocrystal-monohydrate, (MACROBID) 100 MG capsule, Take 1 capsule (100 mg total) by mouth 2 (two) times daily., Disp: 14 capsule, Rfl: 0;  phenazopyridine (PYRIDIUM) 100 MG tablet, Take 1 tablet (100 mg total) by mouth 3 (three) times daily as needed for Pain., Disp: 10 tablet, Rfl: 0        Review of Systems  No acute fatigue, no significiant weight changes. Pt denies HA/visual changes/dizziness. No chest pain/SOB/palpitation symptoms. Denies any GI changes. No acute neurologic changes. All other systems were reviewed and was negative. GU as above.         Objective:    Physical Exam  BP 114/79  Pulse 98  LMP 09/30/2011  NAD. CV: RRR, no murmur or gallops. Resp: CTAB, no rhonchi or crackles. Abdom: Soft, NT/ND. Positive  BS. No masses. Ext: no edema. Skin: no abnormal rash. Neuro: sitting in wheel chair, upper extremity without changes in MS from baseline.   Udip: trace LE, nitrate positive.       Assessment:       Encounter Diagnosis   Name Primary?   . Dysuria  - likely recurrent UTI.  Yes           Plan:       1. Urine culture sent.   2. Will start Macrobid 100mg  BID x 7days.  3. Pt is planning on moving to [place], will send medical records when requested.     Orders Placed This Encounter   Procedures   . Urine culture   . POCT urinalysis dipstick

## 2011-10-12 LAB — SENSITIVITY I

## 2011-10-12 LAB — SENSITIVITY II

## 2011-10-12 LAB — URINE CULTURE

## 2011-10-18 NOTE — Progress Notes (Signed)
Noted  

## 2011-10-20 ENCOUNTER — Other Ambulatory Visit (INDEPENDENT_AMBULATORY_CARE_PROVIDER_SITE_OTHER): Payer: Self-pay | Admitting: Family Medicine

## 2011-10-20 ENCOUNTER — Ambulatory Visit (INDEPENDENT_AMBULATORY_CARE_PROVIDER_SITE_OTHER): Payer: Medicare Other

## 2011-10-20 DIAGNOSIS — N390 Urinary tract infection, site not specified: Secondary | ICD-10-CM

## 2011-10-20 LAB — POCT URINALYSIS DIPSTIX (10)(MULTI-TEST)
Bilirubin, UA POCT: NEGATIVE
Blood, UA POCT: NEGATIVE
Glucose, UA POCT: NEGATIVE mg/dL
Ketones, UA POCT: NEGATIVE mg/dL
Nitrite, UA POCT: NEGATIVE
POCT Spec Gravity, UA: 1.015 (ref 1.001–1.035)
POCT pH, UA: 6 (ref 5–8)
Protein, UA POCT: NEGATIVE mg/dL
Urobilinogen, UA: 0.2 mg/dL

## 2011-10-20 MED ORDER — LEVOFLOXACIN 500 MG PO TABS
500.00 mg | ORAL_TABLET | Freq: Every day | ORAL | Status: DC
Start: 2011-10-20 — End: 2011-10-20

## 2011-10-20 MED ORDER — CIPROFLOXACIN HCL 500 MG PO TABS
500.00 mg | ORAL_TABLET | Freq: Two times a day (BID) | ORAL | Status: AC
Start: 2011-10-20 — End: 2011-10-30

## 2011-10-20 MED ORDER — AMOXICILLIN 500 MG PO CAPS
500.00 mg | ORAL_CAPSULE | Freq: Three times a day (TID) | ORAL | Status: AC
Start: 2011-10-20 — End: 2011-10-30

## 2011-10-24 LAB — SENSITIVITY II

## 2011-10-24 LAB — URINE CULTURE

## 2011-11-15 ENCOUNTER — Ambulatory Visit
Admission: RE | Admit: 2011-11-15 | Discharge: 2011-11-15 | Disposition: A | Discharge status: Home / Self Care | Payer: Medicare Other | Source: Ambulatory Visit | Attending: Neurological Surgery | Admitting: Neurological Surgery

## 2011-11-15 ENCOUNTER — Other Ambulatory Visit (INDEPENDENT_AMBULATORY_CARE_PROVIDER_SITE_OTHER): Payer: Self-pay | Admitting: Neurological Surgery

## 2011-11-15 ENCOUNTER — Other Ambulatory Visit: Payer: Self-pay | Admitting: Neurological Surgery

## 2011-11-15 DIAGNOSIS — Z01818 Encounter for other preprocedural examination: Secondary | ICD-10-CM

## 2011-11-15 DIAGNOSIS — G8250 Quadriplegia, unspecified: Secondary | ICD-10-CM

## 2011-11-15 LAB — URINALYSIS, REFLEX TO MICROSCOPIC EXAM IF INDICATED
Bilirubin, UA: NEGATIVE
Glucose, UA: NEGATIVE
Ketones UA: NEGATIVE
Leukocyte Esterase, UA: NEGATIVE
Nitrite, UA: NEGATIVE
Protein, UR: NEGATIVE
Specific Gravity UA: 1.009 (ref 1.001–1.035)
Urine pH: 7 (ref 5.0–8.0)
Urobilinogen, UA: 0.2 EU/dL

## 2011-11-15 LAB — BASIC METABOLIC PANEL
BUN: 9 mg/dL (ref 6.0–20.0)
CO2: 20 mEq/L — ABNORMAL LOW (ref 21–30)
Calcium: 9.7 mg/dL (ref 8.5–10.5)
Chloride: 104 mEq/L (ref 96–109)
Creatinine: 0.6 mg/dL (ref 0.4–1.5)
Glucose: 77 mg/dL (ref 70–100)
Potassium: 3.7 mEq/L (ref 3.5–5.3)
Sodium: 138 mEq/L (ref 135–146)

## 2011-11-15 LAB — CBC
Hematocrit: 41.4 % (ref 37.0–47.0)
Hgb: 13.5 g/dL (ref 12.0–16.0)
MCH: 29.7 pg (ref 28.0–32.0)
MCHC: 32.6 g/dL (ref 32.0–36.0)
MCV: 91.2 fL (ref 80.0–100.0)
MPV: 10.9 fL (ref 9.4–12.3)
Nucleated RBC: 0 /100 WBC (ref 0–1)
Platelets: 254 10*3/uL (ref 140–400)
RBC: 4.54 10*6/uL (ref 4.20–5.40)
RDW: 14 % (ref 12–15)
WBC: 5.7 10*3/uL (ref 3.50–10.80)

## 2011-11-15 LAB — PT/INR
PT INR: 1.1 (ref 0.9–1.1)
PT: 14.3 s (ref 12.6–15.0)

## 2011-11-15 LAB — GFR: EGFR: >60

## 2011-11-15 LAB — APTT: PTT: 35 s (ref 23–37)

## 2011-11-15 LAB — HEMOLYSIS INDEX: Hemolysis Index: 10 Index — ABNORMAL HIGH (ref 0–9)

## 2011-11-16 LAB — ECG 12-LEAD
Atrial Rate: 112 {beats}/min
P Axis: 54 degrees
P-R Interval: 126 ms
Q-T Interval: 312 ms
QRS Duration: 80 ms
QTC Calculation (Bezet): 425 ms
R Axis: 39 degrees
T Axis: 34 degrees
Ventricular Rate: 112 {beats}/min

## 2011-11-20 ENCOUNTER — Ambulatory Visit
Admission: RE | Admit: 2011-11-20 | Payer: Self-pay | Source: Ambulatory Visit | Attending: Neurological Surgery | Admitting: Neurological Surgery

## 2011-11-27 ENCOUNTER — Ambulatory Visit (INDEPENDENT_AMBULATORY_CARE_PROVIDER_SITE_OTHER): Payer: Medicare Other | Admitting: Family Medicine

## 2011-11-27 ENCOUNTER — Encounter (INDEPENDENT_AMBULATORY_CARE_PROVIDER_SITE_OTHER): Payer: Self-pay | Admitting: Family Medicine

## 2011-11-27 VITALS — BP 117/81 | HR 96 | Temp 97.5°F

## 2011-11-27 DIAGNOSIS — L0390 Cellulitis, unspecified: Secondary | ICD-10-CM

## 2011-11-27 DIAGNOSIS — L0291 Cutaneous abscess, unspecified: Secondary | ICD-10-CM

## 2011-11-27 MED ORDER — CEPHALEXIN 500 MG PO CAPS
500.00 mg | ORAL_CAPSULE | Freq: Four times a day (QID) | ORAL | Status: AC
Start: 2011-11-27 — End: 2011-12-04

## 2011-11-27 NOTE — Progress Notes (Signed)
1. Have you self referred yourself since we last saw you?    Refer to care team   Or  Add specialists:    Yes

## 2011-11-27 NOTE — Progress Notes (Signed)
Subjective:       Patient ID: [name] [name] is a 26 y.o. female.    HPI  This is 26 y/o F with Quadraplegia here for follow up. Pt has had Rt abdominal mediation feeder implant battery replacement 2 weeks ago, wound healing well.   Pt has been self-cathing at home through umbilical opening, has recently been feeling irritation and redness for last 2 weeks. No fever/chills. No abnormal discharge.     The following portions of the patient's history were reviewed and updated as appropriate: allergies, current medications, past family history, past medical history, past social history, past surgical history and problem list.    Review of Systems  No acute fatigue, no significiant weight changes. Pt denies HA/visual changes/dizziness. No chest pain/SOB/palpitation symptoms. Denies any GI changes. No acute neurologic changes. All other systems were reviewed and was negative. SKin as above.         Objective:    Physical Exam  BP 117/81  Pulse 96  Temp(Src) 97.5 F (36.4 C) (Axillary)  LMP 11/06/2011  NAD. CV: RRR, no murmur or gallops. Resp: CTAB, no rhonchi or crackles. Abdom: Soft, NT/ND. Positive BS. No masses. Ext: no edema. Skin: positive rash around umbilical skin 4-5 mm out side of opening. No abnormal discharge.  Neuro: normal gait, normal motor function exam, sensation intact.         Assessment:       Encounter Diagnosis   Name Primary?   . Cellulitis  - around umbilical area.  Yes           Plan:       1. Discussed no need for urine exam today.   2. Likely having inflammation/cellulitis of skin.   3. Will start Keflex 500mg  QID x 7 days.   4. Apply neosporin ointment BID   5. Return in 3-4 days if having worsening redness or swelling.

## 2011-11-28 ENCOUNTER — Encounter (INDEPENDENT_AMBULATORY_CARE_PROVIDER_SITE_OTHER): Payer: Self-pay | Admitting: Neurological Surgery

## 2011-11-29 DIAGNOSIS — G8250 Quadriplegia, unspecified: Secondary | ICD-10-CM

## 2011-12-05 ENCOUNTER — Other Ambulatory Visit (INDEPENDENT_AMBULATORY_CARE_PROVIDER_SITE_OTHER): Payer: Self-pay

## 2011-12-05 MED ORDER — FLUOXETINE HCL 20 MG PO CAPS
20.00 mg | ORAL_CAPSULE | Freq: Every day | ORAL | Status: DC
Start: 2011-12-05 — End: 2011-12-14

## 2011-12-05 NOTE — Telephone Encounter (Signed)
Message copied by Tomie China on Tue Dec 05, 2011  3:14 PM  ------       Message from: Brunilda Payor       Created: Tue Dec 05, 2011  1:34 PM       Regarding: refill req         FLUoxetine (PROZAC) 20 MG capsule       1 a day               [id number] - [place], [place] - [id number] AT [facility] [phone] (Phone)               See's Selena Batten

## 2011-12-08 ENCOUNTER — Telehealth (INDEPENDENT_AMBULATORY_CARE_PROVIDER_SITE_OTHER): Payer: Self-pay

## 2011-12-08 NOTE — Telephone Encounter (Signed)
Left message for pt's mother to let her know that I am working on getting in contact with [employer] rep to be at pt's appt so she can be provided with the intrathecal bacofen pump read-out that she requested.

## 2011-12-11 ENCOUNTER — Emergency Department
Admission: EM | Admit: 2011-12-11 | Discharge: 2011-12-11 | Disposition: A | Discharge status: Home / Self Care | Payer: Medicare Other | Attending: Emergency Medical Services | Admitting: Emergency Medical Services

## 2011-12-11 ENCOUNTER — Emergency Department: Payer: Medicare Other

## 2011-12-11 DIAGNOSIS — S0990XA Unspecified injury of head, initial encounter: Secondary | ICD-10-CM | POA: Insufficient documentation

## 2011-12-11 DIAGNOSIS — G808 Other cerebral palsy: Secondary | ICD-10-CM | POA: Insufficient documentation

## 2011-12-11 DIAGNOSIS — W050XXA Fall from non-moving wheelchair, initial encounter: Secondary | ICD-10-CM | POA: Insufficient documentation

## 2011-12-11 NOTE — ED Provider Notes (Signed)
History     Chief Complaint   Patient presents with   . Head Injury     Chief Complaint:   Chief Complaint   Patient presents with   . Head Injury       Historian:  Self, mother  Onset:   Abrupt, 2 hours pta  Severity:  mild     26 y.o. female patient fell backwards in wheelchair, while aid was helping her clean her legs, hit head on tile floor.  No LOC, no vomiting.  Had a slight HA, but took tylenol and reports no HA, presently.  She had some nausea that has also since resolved.  Acting normal, feels well, currently.      PMD:  Selena Batten            Past Medical History   Diagnosis Date   . Depression    . Muscle spasm    . Neuromuscular disorder    . Cerebral palsy    . Quadriplegia congenital       Past Surgical History   Procedure Date   . Hip surgery    . Bladder surgery    . Baclofen pump implantation        Family History   Problem Relation Age of Onset   . Hypertension Mother    . Diabetes Father        Social  History   Substance Use Topics   . Smoking status: Never Smoker    . Smokeless tobacco: Never Used   . Alcohol Use: No       No Known Allergies    Current/Home Medications    DOCUSATE SODIUM (COLACE) 100 MG CAPSULE    Take 200 mg by mouth 2 (two) times daily.    FLUOXETINE (PROZAC) 20 MG CAPSULE    Take 1 capsule (20 mg total) by mouth daily.    GABAPENTIN (NEURONTIN) 600 MG TABLET    Take 300 mg by mouth 2 (two) times daily. 3 pills 3 times daily    MULTIPLE VITAMIN (MULTIVITAMIN) CAPSULE    Take 1 capsule by mouth daily.    OXYBUTYNIN (DITROPAN-XL) 10 MG 24 HR TABLET    Take 10 mg by mouth daily.    PREGABALIN (LYRICA) 75 MG CAPSULE    Take 75 mg by mouth daily.    PROBIOTIC PRODUCT (ALIGN) 4 MG CAPS    Take by mouth.    TOPIRAMATE (TOPAMAX) 50 MG TABLET    Take 50 mg by mouth daily.    VITAMIN D, CHOLECALCIFEROL, PO    Take 2,000 Unit by mouth daily.        Review of Systems   Constitutional: Negative for fever and chills.   HENT: Negative for congestion.    Respiratory: Negative for cough.     Gastrointestinal: Positive for nausea. Negative for vomiting and diarrhea.   Neurological: Negative for headaches.   All other systems reviewed and are negative.        Physical Exam    BP 124/76  Pulse 85  Temp(Src) 97.7 F (36.5 C) (Axillary)  Resp 18  Ht 1.448 m  Wt 54.432 kg  BMI 25.97 kg/m2  SpO2 100%  LMP 10/30/2011    Physical Exam   Constitutional: She is oriented to person, place, and time. She appears well-developed and well-nourished.   HENT:   Head: Normocephalic and atraumatic.        No swelling, no palpable skull fx or pain to palpation of scalp.  Eyes: Conjunctivae normal are normal.   Neck: Normal range of motion. Neck supple.        nontender   Pulmonary/Chest: Effort normal. No respiratory distress.   Lymphadenopathy:     She has no cervical adenopathy.   Neurological: She is alert and oriented to person, place, and time.   Psychiatric: She has a normal mood and affect. Her behavior is normal.       MDM and ED Course     ED Medication Orders     None           MDM      Procedures    Clinical Impression & Disposition     Clinical Impression  Final diagnoses:   Minor head injury        ED Disposition     Discharge [name] discharge to home/self care.    Condition at discharge: Good             New Prescriptions    No medications on file             Clinical Course / MDM     Working Differential (not completely inclusive):     Notes: head injury instructions discussed.  Return if vomiting, HA, changes in sx.       Data Review     Nursing records reviewed and agree: Yes    Laboratory results reviewed by ED provider: na    Radiologic study results reviewed by ED provider: na    Rendering Provider: Reine Just, PA-C    Attending's signature signifies review of the provider note and clinical impression.    Diagnostic Study Results     Labs  Results     ** No Results found for the last 24 hours. **          Radiologic Studies  Radiology Results (24 Hour)     ** No Results  found for the last 24 hours. **          Signout     Patient signed out to:      Signout notes:        Prescriptions     New Prescriptions    No medications on file           Joice Lofts, [place]  12/11/11 1411

## 2011-12-11 NOTE — ED Notes (Addendum)
Pt fell back out of chair hitting head on tile. Pt c/o pain in back of head. Denies neck pain, but pt reports nausea at time of fall. Pt denies nausea now

## 2011-12-11 NOTE — Discharge Instructions (Signed)
Thank you for choosing [facility] for your emergency care needs.  We strive to provide EXCELLENT care to you and your family.      If you do not continue to improve or your condition worsens, please contact your doctor or return immediately to the Emergency Department.    DOCTOR REFERRALS  Call [phone] if you need any further referrals and we can help you find a primary care doctor or specialist.  Also, available online at:  [email or web address]    YOUR CONTACT INFORMATION  Before leaving please check with registration to make sure we have an up-to-date contact number.  You can call registration at [phone] to update your information.  For questions about your hospital bill, please call [phone].  For questions about your Emergency Dept Physician bill please call [phone].      FREE HEALTH SERVICES  If you need help with health or social services, please call 2-1-1 for a free referral to resources in your area.  2-1-1 is a free service connecting people with information on health insurance, free clinics, pregnancy, mental health, dental care, food assistance, housing, and substance abuse counseling.  Also, available online at:  [email or web address]    MEDICAL RECORDS AND TESTS  Certain laboratory test results do not come back the same day, for example urine cultures.   We will contact you if other important findings are noted.  Radiology films are often reviewed again to ensure accuracy.  If there is any discrepancy, we will notify you.      Please call [phone] to pick up a complimentary CD of any radiology studies performed.  If you or your doctor would like to request a copy of your medical records, please call [phone].      ORTHOPEDIC INJURY   Please know that significant injuries can exist even when an initial x-ray is read as normal or negative.  This can occur because some fractures (broken bones) are not initially visible on x-rays.   For this reason, close outpatient follow-up with your primary care doctor or bone specialist (orthopedist) is required.    MEDICATIONS AND FOLLOWUP  Please be aware that some prescription medications can cause drowsiness.  Use caution when driving or operating machinery.    The examination and treatment you have received in our Emergency Department is provided on an emergency basis, and is not intended to be a substitute for your primary care physician.  It is important that your doctor checks you again and that you report any new or remaining problems at that time.

## 2011-12-13 DIAGNOSIS — G8250 Quadriplegia, unspecified: Secondary | ICD-10-CM

## 2011-12-14 ENCOUNTER — Encounter (INDEPENDENT_AMBULATORY_CARE_PROVIDER_SITE_OTHER): Payer: Self-pay | Admitting: Family Medicine

## 2011-12-14 ENCOUNTER — Ambulatory Visit (INDEPENDENT_AMBULATORY_CARE_PROVIDER_SITE_OTHER): Payer: Medicare Other | Admitting: Family Medicine

## 2011-12-14 VITALS — BP 123/82 | HR 96 | Temp 98.0°F | Resp 18

## 2011-12-14 DIAGNOSIS — F3289 Other specified depressive episodes: Secondary | ICD-10-CM

## 2011-12-14 DIAGNOSIS — K5904 Chronic idiopathic constipation: Secondary | ICD-10-CM

## 2011-12-14 DIAGNOSIS — K5909 Other constipation: Secondary | ICD-10-CM

## 2011-12-14 DIAGNOSIS — F32A Depression, unspecified: Secondary | ICD-10-CM

## 2011-12-14 MED ORDER — FLUOXETINE HCL 20 MG PO CAPS
20.00 mg | ORAL_CAPSULE | Freq: Every day | ORAL | Status: AC
Start: 2011-12-14 — End: 2012-12-13

## 2011-12-14 NOTE — Progress Notes (Signed)
Subjective:       Patient ID: [name] [name] is a 26 y.o. female.    HPI  This is 26 y/o F with Quadraplegia here for follow up. Pt states she's been having on and off constipation chronically, using Colace twice daily on regular basis, occ needing manual stimuation. Has used Miralax prn as well. Recently has not been able to go for last 2-3 days. Does not take any fiber supplement.Also uses prune juice and eating prune daily as well. No sx of abdominal, no fever/chills.   Pt also taking Prozac 20mg  daily for depression, well controlled.     The following portions of the patient's history were reviewed and updated as appropriate:   No Known Allergies    Past Medical History   Diagnosis Date   . Depression    . Muscle spasm    . Neuromuscular disorder    . Cerebral palsy    . Quadriplegia congenital       History     Social History   . Marital Status: Single     Spouse Name: N/A     Number of Children: N/A   . Years of Education: N/A     Occupational History   . Not on file.     Social History Main Topics   . Smoking status: Never Smoker    . Smokeless tobacco: Never Used   . Alcohol Use: No   . Drug Use: No   . Sexually Active:      Other Topics Concern   . Not on file     Social History Narrative   . No narrative on file       Past Surgical History   Procedure Date   . Hip surgery    . Bladder surgery    . Baclofen pump implantation        Patient Active Problem List   Diagnosis   . Cerebral palsy with spastic/ataxic diplegia   . Quadriplegia       Current outpatient prescriptions:docusate sodium (COLACE) 100 MG capsule, Take 200 mg by mouth 2 (two) times daily., Disp: , Rfl: ;  FLUoxetine (PROZAC) 20 MG capsule, Take 1 capsule (20 mg total) by mouth daily., Disp: 90 capsule, Rfl: 1;  gabapentin (NEURONTIN) 600 MG tablet, Take 300 mg by mouth. 3 pills 3 times a day, Disp: , Rfl: ;  Multiple Vitamin (MULTIVITAMIN) capsule, Take 1 capsule by mouth daily., Disp: , Rfl:   oxybutynin (DITROPAN-XL) 10 MG 24 hr  tablet, Take 10 mg by mouth 2 (two) times daily. , Disp: , Rfl: ;  pregabalin (LYRICA) 75 MG capsule, Take 75 mg by mouth daily., Disp: , Rfl: ;  Probiotic Product (ALIGN) 4 MG CAPS, Take by mouth., Disp: , Rfl: ;  topiramate (TOPAMAX) 50 MG tablet, Take 50 mg by mouth daily., Disp: , Rfl: ;  VITAMIN D, CHOLECALCIFEROL, PO, Take 2,000 Unit by mouth daily., Disp: , Rfl:       Review of Systems  No acute fatigue, no significiant weight changes. Pt denies HA/visual changes/dizziness. No chest pain/SOB/palpitation symptoms. Denies any GI changes. No acute neurologic changes. All other systems were reviewed and was negative.         Objective:    Physical Exam  BP 123/82  Pulse 96  Temp(Src) 98 F (36.7 C) (Oral)  Resp 18  LMP 12/05/2011  Pt on wheel chair. Normal baseline affect. NAD. CV: RRR, no murmur or gallops. Resp: CTAB, no  rhonchi or crackles. Abdom: Soft, NT/ND. Positive BS. No masses. Ext: no edema. Skin: no abnormal rash. Neuro: normal gait, normal motor function exam, sensation intact.         Assessment:       Encounter Diagnoses   Name Primary?   . Depression Yes   . Constipation - functional            Plan:       1. Will continue Prozac 20mg  daily.   2. Discussed incorporating Fiber supplement BID as well as increased fluids daily.   3. Also can take Senna prn to titrate for BM. Can use Fleet enema as last resort if needed.   4. Pt will be moving to [place] in 2 weeks, will be finding a new physical there.     I have spent 25 minutes during this visit, more than half of visit spent on patient education and instructions.     No orders of the defined types were placed in this encounter.

## 2011-12-14 NOTE — Progress Notes (Signed)
1. Have you self referred yourself since we last saw you? Yes    Refer to care team   Or  Add specialists:

## 2011-12-15 ENCOUNTER — Encounter (INDEPENDENT_AMBULATORY_CARE_PROVIDER_SITE_OTHER): Payer: Self-pay | Admitting: Family Medicine

## 2012-06-10 NOTE — Op Note (Unsigned)
DATE OF SURGERY:                    12/27/2000            SURGEON:                            Anda Latina, MD            ASSISTANT(S):                  PREOPERATIVE DIAGNOSIS:  Cerebral palsy  with  limited  useful life for the      Baclofen pump, being close to the expiration date for the pump.            POSTOPERATIVE DIAGNOSIS:  Leak due to tear in plastic at tie connector near      pump.            SPECIMENS:  None.            COMPLICATIONS:  None.            PROCEDURE:  Replacement of Baclofen pump.            ESTIMATED BLOOD LOSS:  Less than 2 ml.            CONDITION:  Tolerated well.            DESCRIPTION   OF  PROCEDURE:   After  satisfactory   general   endotracheal      anesthesia was administered the patient  was  positioned  on  the operating      table in the supine position.  The right  upper  quadrant  was  prepped and      draped in a routine manner.  The old  skin  incision  was  infiltrated with      0.25% Marcaine with epinephrine 1:200,000,  and  then  opened  in a routine      manner.  The fibrotic pocket in which  the  pump rested was entered and out      came very xanthochromic fluid which rapidly  emptied  the  pump space.  The      capsule was opened horizontally along its entirety, and the pump was easily      removed and the tubing was clamped.            The pump was primed.  When we went to exchange the pumps, we noted that the      plastic was torn underneath the tie connector,  so  we replaced the plastic      connector.  In order to do that we had  to  resect  out 4.5 cm of the pump.      We easily inserted a new connector which was a right-angle type and secured      all the connections to silk ligatures.   When we re-placed the pump back in      the pocket we made sure there was no kinking in the tubing which was placed      deep to the pump itself.            The capsule was reapproximated with  2-0  Vicryl  as  well  as subcutaneous      tissue.   A  subcuticular  closure  was    used  followed  by  benzoin  and      Steri-Strips.  The patient tolerated  the  procedure  well.   We inserted a      pump model #8627-AT.  At the conclusion  we noted some fluid leaking around      it.  We reprogrammed the pump to a different            infusion rate.  WE brought it down from  380  mcg  down to 300 mcg per day.      The patient tolerated the procedure well.                                                                          _____________________________________                                            _____                                            Anda Latina, MD      Reece Agar [id number]      D: 07/11/200210:43 A      T: 12/28/2000  9:26 P      J: [id number]      N: [id number]      CC: Anda Latina, MD

## 2012-06-11 NOTE — Op Note (Unsigned)
DATE OF BIRTH:                        1986/05/16      ADMISSION DATE:                     06/12/2003            PATIENT LOCATION:                     [id number]            DATE OF PROCEDURE:                   06/12/2003      SURGEON:                            Anda Latina, MD      ASSISTANT(S):                  PREOPERATIVE DIAGNOSIS:  LOOSE BACLOFEN PUMP; PATIENT WITH CEREBRAL PALSY.            POSTOPERATIVE DIAGNOSIS:  LOOSE BACLOFEN PUMP; PATIENT WITH CEREBRAL      PALSY.            PROCEDURE:  REVISION OF BACLOFEN PUMP.            ASSISTANT:  None.            ANESTHESIA:  General endotracheal tube anesthesia.            INDICATION FOR OPERATION:  Flipping pump in subcutaneous seromatous      pocket.            FINDINGS:  Xanthochromic fluid in pump space with patient having a pump      flipped so that the tapping reservoir was in a downward position. No      obvious site of CSF leak from the pump. Procedure: Repair of flipped      baclofen pump by attaching a Dacron pouch to the pump and securing it in      the subcutaneous pocket.            SPECIMENS:  Fluid for routine studies including glucose, protein, cell      count, culture and sensitivity as well as beta-2 transferrin and a baclofen      level. Beta-2 transferrin was sent off to see whether or not this was CSF      and baclofen to see whether or not there is actually a leak from the pump.            COMPLICATIONS:  None.            ANESTHESIA:  General endotracheal tube anesthesia with local, 0.25%      Marcaine with 1:200,000 epinephrine.            SKIN PREPARATION:  Betadine.            DESCRIPTION OF PROCEDURE:  After satisfactory general endotracheal tube      anesthesia was administered the old skin incision was reopened after      infiltrating the skin with 0.25% Marcaine with 1:200,000 epinephrine. The      area of the proposed skin incision was carried down to the subcutaneous      pocket where the baclofen pump was identified. We saw that  there was some  xanthochromic, fairly clear fluid around the pump which was sent for the      above-mentioned studies and that the pump was flipped. I, therefore,      reflipped the baclofen pump, paying careful attention to place the tubing      underneath the pump and then I placed the pump in a Dacron pouch and      secured the Dacron pouch in 4 quadrants with a Nurolon suture. The pouch      was then reapproximated with 3-0 Vicryl followed by 4-0 Vicryl subcutaneous      sutures and a Monocryl for the skin. Mastisol and Steri-Strips were      applied.      The patient was awakened and extubated, having tolerated the procedure well      without complication.                                    ___________________________________          Date Signed: __________      Anda Latina, MD  ([id number])            D: 06/13/2003 by Anda Latina, MD      T: 06/14/2003 by [id number] ([id number]) ([id number])      cc:  Anda Latina, MD

## 2016-02-18 ENCOUNTER — Other Ambulatory Visit: Payer: Self-pay | Admitting: Family Medicine

## 2016-02-18 ENCOUNTER — Ambulatory Visit: Payer: Self-pay

## 2016-02-18 ENCOUNTER — Ambulatory Visit: Payer: Medicare Other | Attending: Family Medicine

## 2016-02-18 DIAGNOSIS — L89154 Pressure ulcer of sacral region, stage 4: Secondary | ICD-10-CM

## 2016-02-18 DIAGNOSIS — G8220 Paraplegia, unspecified: Secondary | ICD-10-CM | POA: Insufficient documentation

## 2016-02-18 DIAGNOSIS — L89309 Pressure ulcer of unspecified buttock, unspecified stage: Secondary | ICD-10-CM | POA: Insufficient documentation

## 2016-02-18 DIAGNOSIS — G809 Cerebral palsy, unspecified: Secondary | ICD-10-CM | POA: Insufficient documentation

## 2016-02-18 DIAGNOSIS — L8992 Pressure ulcer of unspecified site, stage 2: Secondary | ICD-10-CM | POA: Insufficient documentation

## 2016-02-18 DIAGNOSIS — M869 Osteomyelitis, unspecified: Secondary | ICD-10-CM

## 2016-02-18 DIAGNOSIS — G609 Hereditary and idiopathic neuropathy, unspecified: Secondary | ICD-10-CM | POA: Insufficient documentation

## 2016-02-18 DIAGNOSIS — L89314 Pressure ulcer of right buttock, stage 4: Secondary | ICD-10-CM | POA: Insufficient documentation

## 2016-02-18 MED ORDER — DOXYCYCLINE HYCLATE 100 MG PO TABS
100.0000 mg | ORAL_TABLET | Freq: Two times a day (BID) | ORAL | Status: AC
Start: 2016-02-18 — End: 2016-03-03

## 2016-02-18 MED ORDER — OXYCODONE-ACETAMINOPHEN 5-325 MG PO TABS
1.0000 | ORAL_TABLET | Freq: Four times a day (QID) | ORAL | Status: AC | PRN
Start: 2016-02-18 — End: 2016-02-25

## 2016-02-18 MED ORDER — CIPROFLOXACIN HCL 500 MG PO TABS
500.0000 mg | ORAL_TABLET | Freq: Two times a day (BID) | ORAL | Status: AC
Start: 2016-02-18 — End: 2016-03-03

## 2016-02-18 MED ORDER — OXYCODONE-ACETAMINOPHEN 5-325 MG PO TABS
1.0000 | ORAL_TABLET | Freq: Four times a day (QID) | ORAL | Status: DC | PRN
Start: 2016-02-18 — End: 2016-02-18

## 2016-02-24 LAB — LAB USE ONLY - HISTORICAL SURGICAL PATHOLOGY

## 2016-02-25 ENCOUNTER — Ambulatory Visit: Payer: Medicare Other | Attending: Family Medicine

## 2016-02-25 DIAGNOSIS — L89892 Pressure ulcer of other site, stage 2: Secondary | ICD-10-CM | POA: Insufficient documentation

## 2016-02-25 DIAGNOSIS — G609 Hereditary and idiopathic neuropathy, unspecified: Secondary | ICD-10-CM | POA: Insufficient documentation

## 2016-02-25 DIAGNOSIS — G809 Cerebral palsy, unspecified: Secondary | ICD-10-CM | POA: Insufficient documentation

## 2016-02-25 DIAGNOSIS — M1990 Unspecified osteoarthritis, unspecified site: Secondary | ICD-10-CM | POA: Insufficient documentation

## 2016-02-25 DIAGNOSIS — L89314 Pressure ulcer of right buttock, stage 4: Secondary | ICD-10-CM | POA: Insufficient documentation

## 2016-02-25 DIAGNOSIS — L89154 Pressure ulcer of sacral region, stage 4: Secondary | ICD-10-CM

## 2016-03-03 ENCOUNTER — Ambulatory Visit: Payer: Medicare Other | Attending: Family Medicine

## 2016-03-03 DIAGNOSIS — G609 Hereditary and idiopathic neuropathy, unspecified: Secondary | ICD-10-CM | POA: Insufficient documentation

## 2016-03-03 DIAGNOSIS — N390 Urinary tract infection, site not specified: Secondary | ICD-10-CM | POA: Insufficient documentation

## 2016-03-03 DIAGNOSIS — L89893 Pressure ulcer of other site, stage 3: Secondary | ICD-10-CM | POA: Insufficient documentation

## 2016-03-03 DIAGNOSIS — L89154 Pressure ulcer of sacral region, stage 4: Secondary | ICD-10-CM

## 2016-03-03 DIAGNOSIS — M1990 Unspecified osteoarthritis, unspecified site: Secondary | ICD-10-CM | POA: Insufficient documentation

## 2016-03-03 DIAGNOSIS — L89314 Pressure ulcer of right buttock, stage 4: Secondary | ICD-10-CM | POA: Insufficient documentation

## 2016-03-03 DIAGNOSIS — G809 Cerebral palsy, unspecified: Secondary | ICD-10-CM | POA: Insufficient documentation

## 2016-03-10 ENCOUNTER — Ambulatory Visit: Payer: Medicare Other | Attending: Family Medicine

## 2016-03-10 DIAGNOSIS — Z87440 Personal history of urinary (tract) infections: Secondary | ICD-10-CM | POA: Insufficient documentation

## 2016-03-10 DIAGNOSIS — L89314 Pressure ulcer of right buttock, stage 4: Secondary | ICD-10-CM | POA: Insufficient documentation

## 2016-03-10 DIAGNOSIS — G609 Hereditary and idiopathic neuropathy, unspecified: Secondary | ICD-10-CM | POA: Insufficient documentation

## 2016-03-10 DIAGNOSIS — G809 Cerebral palsy, unspecified: Secondary | ICD-10-CM | POA: Insufficient documentation

## 2016-03-10 DIAGNOSIS — L89154 Pressure ulcer of sacral region, stage 4: Secondary | ICD-10-CM

## 2016-03-10 DIAGNOSIS — M1990 Unspecified osteoarthritis, unspecified site: Secondary | ICD-10-CM | POA: Insufficient documentation

## 2016-03-10 DIAGNOSIS — L89893 Pressure ulcer of other site, stage 3: Secondary | ICD-10-CM | POA: Insufficient documentation

## 2016-03-17 ENCOUNTER — Ambulatory Visit: Payer: Medicare Other | Attending: Family Medicine

## 2016-03-17 DIAGNOSIS — L89154 Pressure ulcer of sacral region, stage 4: Secondary | ICD-10-CM

## 2016-03-17 DIAGNOSIS — G609 Hereditary and idiopathic neuropathy, unspecified: Secondary | ICD-10-CM | POA: Insufficient documentation

## 2016-03-17 DIAGNOSIS — M1990 Unspecified osteoarthritis, unspecified site: Secondary | ICD-10-CM | POA: Insufficient documentation

## 2016-03-17 DIAGNOSIS — L89314 Pressure ulcer of right buttock, stage 4: Secondary | ICD-10-CM | POA: Insufficient documentation

## 2016-03-17 DIAGNOSIS — L89893 Pressure ulcer of other site, stage 3: Secondary | ICD-10-CM | POA: Insufficient documentation

## 2016-03-17 DIAGNOSIS — G809 Cerebral palsy, unspecified: Secondary | ICD-10-CM | POA: Insufficient documentation

## 2016-03-24 ENCOUNTER — Other Ambulatory Visit: Payer: Self-pay | Admitting: Family

## 2016-03-24 ENCOUNTER — Ambulatory Visit: Payer: Medicare Other | Attending: Family Medicine

## 2016-03-24 DIAGNOSIS — M1990 Unspecified osteoarthritis, unspecified site: Secondary | ICD-10-CM | POA: Insufficient documentation

## 2016-03-24 DIAGNOSIS — L89893 Pressure ulcer of other site, stage 3: Secondary | ICD-10-CM | POA: Insufficient documentation

## 2016-03-24 DIAGNOSIS — G809 Cerebral palsy, unspecified: Secondary | ICD-10-CM | POA: Insufficient documentation

## 2016-03-24 DIAGNOSIS — L89314 Pressure ulcer of right buttock, stage 4: Secondary | ICD-10-CM | POA: Insufficient documentation

## 2016-03-24 DIAGNOSIS — Z87440 Personal history of urinary (tract) infections: Secondary | ICD-10-CM | POA: Insufficient documentation

## 2016-03-24 DIAGNOSIS — G609 Hereditary and idiopathic neuropathy, unspecified: Secondary | ICD-10-CM | POA: Insufficient documentation

## 2016-03-31 ENCOUNTER — Ambulatory Visit: Payer: Medicare Other | Attending: Family Medicine

## 2016-03-31 DIAGNOSIS — G609 Hereditary and idiopathic neuropathy, unspecified: Secondary | ICD-10-CM | POA: Insufficient documentation

## 2016-03-31 DIAGNOSIS — M1990 Unspecified osteoarthritis, unspecified site: Secondary | ICD-10-CM | POA: Insufficient documentation

## 2016-03-31 DIAGNOSIS — L89304 Pressure ulcer of unspecified buttock, stage 4: Secondary | ICD-10-CM

## 2016-03-31 DIAGNOSIS — Z87440 Personal history of urinary (tract) infections: Secondary | ICD-10-CM | POA: Insufficient documentation

## 2016-03-31 DIAGNOSIS — L89314 Pressure ulcer of right buttock, stage 4: Secondary | ICD-10-CM | POA: Insufficient documentation

## 2016-03-31 DIAGNOSIS — G809 Cerebral palsy, unspecified: Secondary | ICD-10-CM | POA: Insufficient documentation

## 2016-03-31 DIAGNOSIS — L89893 Pressure ulcer of other site, stage 3: Secondary | ICD-10-CM | POA: Insufficient documentation

## 2016-04-07 ENCOUNTER — Ambulatory Visit: Payer: Medicare Other | Attending: Family Medicine

## 2016-04-07 DIAGNOSIS — L89314 Pressure ulcer of right buttock, stage 4: Secondary | ICD-10-CM | POA: Insufficient documentation

## 2016-04-07 DIAGNOSIS — N390 Urinary tract infection, site not specified: Secondary | ICD-10-CM | POA: Insufficient documentation

## 2016-04-07 DIAGNOSIS — G609 Hereditary and idiopathic neuropathy, unspecified: Secondary | ICD-10-CM | POA: Insufficient documentation

## 2016-04-07 DIAGNOSIS — M1990 Unspecified osteoarthritis, unspecified site: Secondary | ICD-10-CM | POA: Insufficient documentation

## 2016-04-07 DIAGNOSIS — L89304 Pressure ulcer of unspecified buttock, stage 4: Secondary | ICD-10-CM

## 2016-04-07 DIAGNOSIS — L98492 Non-pressure chronic ulcer of skin of other sites with fat layer exposed: Secondary | ICD-10-CM | POA: Insufficient documentation

## 2016-04-07 DIAGNOSIS — G809 Cerebral palsy, unspecified: Secondary | ICD-10-CM | POA: Insufficient documentation

## 2016-04-14 ENCOUNTER — Ambulatory Visit: Payer: Medicare Other | Attending: Family Medicine

## 2016-04-14 DIAGNOSIS — L89314 Pressure ulcer of right buttock, stage 4: Secondary | ICD-10-CM | POA: Insufficient documentation

## 2016-04-14 DIAGNOSIS — G609 Hereditary and idiopathic neuropathy, unspecified: Secondary | ICD-10-CM | POA: Insufficient documentation

## 2016-04-14 DIAGNOSIS — L89893 Pressure ulcer of other site, stage 3: Secondary | ICD-10-CM | POA: Insufficient documentation

## 2016-04-14 DIAGNOSIS — G809 Cerebral palsy, unspecified: Secondary | ICD-10-CM | POA: Insufficient documentation

## 2016-04-14 DIAGNOSIS — L89312 Pressure ulcer of right buttock, stage 2: Secondary | ICD-10-CM

## 2016-04-21 ENCOUNTER — Ambulatory Visit: Payer: Medicare Other

## 2016-04-28 ENCOUNTER — Ambulatory Visit: Payer: Medicare Other | Attending: Family Medicine

## 2016-04-28 DIAGNOSIS — L89893 Pressure ulcer of other site, stage 3: Secondary | ICD-10-CM | POA: Insufficient documentation

## 2016-04-28 DIAGNOSIS — L89314 Pressure ulcer of right buttock, stage 4: Secondary | ICD-10-CM | POA: Insufficient documentation

## 2016-04-28 DIAGNOSIS — G809 Cerebral palsy, unspecified: Secondary | ICD-10-CM | POA: Insufficient documentation

## 2016-04-28 DIAGNOSIS — L89312 Pressure ulcer of right buttock, stage 2: Secondary | ICD-10-CM

## 2016-04-28 DIAGNOSIS — G609 Hereditary and idiopathic neuropathy, unspecified: Secondary | ICD-10-CM | POA: Insufficient documentation

## 2016-05-01 ENCOUNTER — Emergency Department: Payer: Medicare Other

## 2016-05-01 ENCOUNTER — Emergency Department
Admission: EM | Admit: 2016-05-01 | Discharge: 2016-05-01 | Disposition: A | Discharge status: Home / Self Care | Payer: Medicare Other | Attending: Emergency Medicine | Admitting: Emergency Medicine

## 2016-05-01 DIAGNOSIS — G809 Cerebral palsy, unspecified: Secondary | ICD-10-CM | POA: Insufficient documentation

## 2016-05-01 DIAGNOSIS — N390 Urinary tract infection, site not specified: Secondary | ICD-10-CM

## 2016-05-01 LAB — URINALYSIS, REFLEX TO MICROSCOPIC EXAM IF INDICATED
Bilirubin, UA: NEGATIVE
Blood, UA: NEGATIVE
Glucose, UA: NEGATIVE
Ketones UA: NEGATIVE
Nitrite, UA: NEGATIVE
Protein, UR: 100 — AB
Specific Gravity UA: 1.025 (ref 1.001–1.035)
Urine pH: 5 (ref 5.0–8.0)
Urobilinogen, UA: NORMAL mg/dL

## 2016-05-01 LAB — BASIC METABOLIC PANEL
Anion Gap: 7 (ref 5.0–15.0)
BUN: 13 mg/dL (ref 7–19)
CO2: 22 mEq/L (ref 22–29)
Calcium: 8.9 mg/dL (ref 8.5–10.5)
Chloride: 107 mEq/L (ref 100–111)
Creatinine: 0.6 mg/dL (ref 0.6–1.0)
Glucose: 92 mg/dL (ref 70–100)
Potassium: 4.1 mEq/L (ref 3.5–5.1)
Sodium: 136 mEq/L (ref 136–145)

## 2016-05-01 LAB — GFR: EGFR: >60

## 2016-05-01 NOTE — ED Notes (Signed)
500 mL drained from bladder via catheter guided by ultrasound - straight cath performed by Dr. Merrily Pew.  Patient verbalized relief and said "I'm feeling so much better now."  Will continue to monitor.

## 2016-05-01 NOTE — ED Triage Notes (Signed)
urinary retention.  Pt self cath via suprapubic stoma, but not able to drain urine.

## 2016-05-01 NOTE — Discharge Instructions (Signed)
Dear [name]:    I appreciate your choosing the [facility] for your healthcare needs, and hope your visit today was EXCELLENT.    Instructions:  Please follow-up with Dr. Selena Batten, your primary care doctor, in 2-3 days. Please follow-up with Dr. Lonia Chimera, urologist, or a urologist of your choice to re-evaluate your stoma in 3-5 days.     Continue Cipro.     Return to the Emergency Department for any worsening symptoms or concerns.    Below is some information that our patients often find helpful.    We wish you good health and please do not hesitate to contact us if we can ever be of any assistance.    Sincerely,  Isidoro Donning, MD  [facility]    ________________________________________________________________    If you do not continue to improve or your condition worsens, please contact your doctor or return immediately to the Emergency Department.    Thank you for choosing [facility] for your emergency care needs.  We strive to provide EXCELLENT care to you and your family.      DOCTOR REFERRALS  Call [phone] if you need any further referrals and we can help you find a primary care doctor or specialist.  Also, available online at:  [email or web address]    YOUR CONTACT INFORMATION  Before leaving please check with registration to make sure we have an up-to-date contact number.  You can call registration at [phone] to update your information.  For questions about your hospital bill, please call [phone].  For questions about your Emergency Dept Physician bill please call [phone].      FREE HEALTH SERVICES  If you need help with health or social services, please call 2-1-1 for a free referral to resources in your area.  2-1-1 is a free service connecting people with information on health insurance, free clinics, pregnancy, mental health, dental care, food assistance, housing, and substance abuse  counseling.  Also, available online at:  [email or web address].org    MEDICAL RECORDS AND TESTS  Certain laboratory test results do not come back the same day, for example urine cultures.   We will contact you if other important findings are noted.  Radiology films are often reviewed again to ensure accuracy.  If there is any discrepancy, we will notify you.      Please call [phone] to pick up a complimentary CD of any radiology studies performed.  If you or your doctor would like to request a copy of your medical records, please call [phone].      ORTHOPEDIC INJURY   Please know that significant injuries can exist even when an initial x-ray is read as normal or negative.  This can occur because some fractures (broken bones) are not initially visible on x-rays.  For this reason, close outpatient follow-up with your primary care doctor or bone specialist (orthopedist) is required.    MEDICATIONS AND FOLLOWUP  Please be aware that some prescription medications can cause drowsiness.  Use caution when driving or operating machinery.    The examination and treatment you have received in our Emergency Department is provided on an emergency basis, and is not intended to be a substitute for your primary care physician.  It is important that your doctor checks you again and that you report any new or remaining problems at that time.      24 HOUR PHARMACIES  [facility] -  [address] [place], [place] [address] (1.4 miles, 7 minutes)  [facility] - [address] [place], [place] [address] (6.5 miles, 13 minutes)  Handout with directions available on request

## 2016-05-01 NOTE — ED Provider Notes (Addendum)
Physician/Midlevel provider first contact with patient: 05/01/16 1550         EMERGENCY DEPARTMENT HISTORY AND PHYSICAL EXAM    Patient Name: [name] [name], [name] [name]  Encounter Date:  05/01/2016  Attending Physician: Netta Neat, MD  Patient DOB:  Feb 07, 1986  MRN:  [id number]  Room:  06/A06    History of Presenting Illness     Historian: Patient     30 y.o. female with h/o recurrent UTIs, cerebral palsy, neuromuscular disorder, and quadriplegia p/w persistent urinary retention secondary to difficulty catheterizing starting today. Associated with suprapubic abdominal pain. Pt states she drank about 3 bottles of water and has only been able to catheterize 1x today (only about 2 cups of measured urine voided). Pt uses a 14 French catheter. Suprapubic stoma placement was at [facility] in 2005. No reported h/o stoma issues. Denies fever, vomiting, and back pain.    PMD:  Eulogio Bear, MD    Past Medical History     Past Medical History:   Diagnosis Date   . Cerebral palsy    . Depression    . Muscle spasm    . Neuromuscular disorder    . Quadriplegia congenital       Past Surgical History     Past Surgical History:   Procedure Laterality Date   . BACLOFEN PUMP IMPLANTATION     . BLADDER SURGERY     . HIP SURGERY         Family History     Family History   Problem Relation Age of Onset   . Hypertension Mother    . Diabetes Father        Social History     Social History     Social History   . Marital status: Single     Spouse name: N/A   . Number of children: N/A   . Years of education: N/A     Social History Main Topics   . Smoking status: Never Smoker   . Smokeless tobacco: Never Used   . Alcohol use No   . Drug use: No   . Sexual activity: Not on file     Other Topics Concern   . Not on file     Social History Narrative   . No narrative on file       Review of Systems     Constitutional:  No fever  GI: +suprapubic abdominal pain, No vomiting  GU: +urinary retention  MS:  No back pain  All other systems reviewed and  negative    Physical Exam     Constitutional: Vital signs reviewed. Chronically ill appearing.  Head: Normocephalic, atraumatic  Eyes: Conjunctiva and sclera are normal.  No injection or discharge.  Ears, Nose, Throat:  Normal external examination of the nose and ears.    Neck: Normal range of motion. Trachea midline.  Respiratory/Chest: Clear to auscultation. No respiratory distress.   Cardiovascular: Regular rate and rhythm. No murmurs.   Abdomen:  No rebound or guarding. Soft.  Non-tender. No redness or swelling of stoma site.    Back:    Upper Extremity:  No edema. No cyanosis. Chronic contractures.  Lower Extremity:  No edema. No cyanosis. Chronic contractures.  Skin: Warm and dry. No rash.  Psychiatric:  Normal affect.  Normal insight.      ED Medications Administered     ED Medication Orders     None          Orders Placed  During This Encounter     Orders Placed This Encounter   Procedures   . Urine Culture   . UA (Reflex to Microscopic)   . Basic Metabolic Panel   . GFR       Diagnostic Study Results     The results of the diagnostic studies below were reviewed by the ED provider:    Labs  Results     Procedure Component Value Units Date/Time    UA (Reflex to Microscopic) [[id number]]  (Abnormal) Collected:  05/01/16 1638    Specimen:  Urine Updated:  05/01/16 1732     Urine Type Catheterized, I     Color, UA Yellow     Clarity, UA Clear     Specific Gravity UA 1.025     Urine pH 5.0     Leukocyte Esterase, UA Small (A)     Nitrite, UA Negative     Protein, UR 100 (A)     Glucose, UA Negative     Ketones UA Negative     Urobilinogen, UA Normal mg/dL      Bilirubin, UA Negative     Blood, UA Negative     RBC, UA 3 - 5 /hpf      WBC, UA 11 - 25 (A) /hpf      Squamous Epithelial Cells, Urine 0 - 5 /hpf      Hyaline Casts, UA 0 - 3 /lpf     Basic Metabolic Panel [[id number]] Collected:  05/01/16 1658    Specimen:  Blood Updated:  05/01/16 1732     Glucose 92 mg/dL      BUN 13 mg/dL      Creatinine 0.6 mg/dL       Calcium 8.9 mg/dL      Sodium 440 mEq/L      Potassium 4.1 mEq/L      Chloride 107 mEq/L      CO2 22 mEq/L      Anion Gap 7.0    GFR [[id number]] Collected:  05/01/16 1658     Updated:  05/01/16 1732     EGFR >60.0    Urine Culture [[id number]] Collected:  05/01/16 1638    Specimen:  Urine from Urine, Catheterized, In & Out Updated:  05/01/16 1643          Radiologic Studies  Radiology Results (24 Hour)     ** No results found for the last 24 hours. **          Scribe and MD Attestations     I, Netta Neat, MD, personally performed the services documented. [name] is scribing for me on [name],[name] I reviewed and confirm the accuracy of the information in this medical record.    I, [name], am serving as a Neurosurgeon to document services personally performed by Netta Neat, MD, based on the provider's statements to me.     Credentials: [name], scribe    Rendering Provider: Netta Neat, MD    Monitors, EKG, Critical Care, Vitals and Splints     Cardiac Monitor (interpreted by ED physician):  na  EKG (interpreted by ED physician): na  Critical Care:   Splint check:      Patient Vitals for the past 24 hrs:   BP Temp Pulse Resp SpO2 Weight   05/01/16 1446 113/72 97.7 F (36.5 C) (!) 107 18 99 % 49.9 kg         MDM and Clinical Notes     Able to  cath easily.  500 ml+ drained.  Cr nl.  UA suggestive of UTI.  Pt already on Cipro.  I recommended abx change to [facility].  Pt and mother prefer to wait until urine culture is back.     Patient presents with uncomplicated UTI and  is clinically well appearing. No clincal evidence of pyelonephritis.  Appears appropriate for outpatient management on antibiotics with primary care physician follow-up.    Advised PCP and Uro fu.    Diagnosis and Disposition     Clinical Impression  1. Urinary tract infection without hematuria, site unspecified          Disposition  ED Disposition     ED Disposition Condition Date/Time Comment    Discharge  Mon May 01, 2016  5:41 PM  [name] discharge to home/self care.    Condition at disposition: Bernarda Caffey, MD  05/01/16 1744       Isidoro Donning, MD  05/01/16 1745

## 2016-05-19 ENCOUNTER — Ambulatory Visit: Payer: Medicare Other | Attending: Family Medicine

## 2016-05-19 DIAGNOSIS — L89893 Pressure ulcer of other site, stage 3: Secondary | ICD-10-CM | POA: Insufficient documentation

## 2016-05-19 DIAGNOSIS — Z87440 Personal history of urinary (tract) infections: Secondary | ICD-10-CM | POA: Insufficient documentation

## 2016-05-19 DIAGNOSIS — L89314 Pressure ulcer of right buttock, stage 4: Secondary | ICD-10-CM

## 2016-05-19 DIAGNOSIS — G609 Hereditary and idiopathic neuropathy, unspecified: Secondary | ICD-10-CM | POA: Insufficient documentation

## 2016-05-19 DIAGNOSIS — M1990 Unspecified osteoarthritis, unspecified site: Secondary | ICD-10-CM | POA: Insufficient documentation

## 2016-05-19 DIAGNOSIS — G809 Cerebral palsy, unspecified: Secondary | ICD-10-CM | POA: Insufficient documentation

## 2016-05-19 MED ORDER — CLOTRIMAZOLE-BETAMETHASONE 1-0.05 % EX CREA
TOPICAL_CREAM | CUTANEOUS | 1 refills | Status: DC
Start: 2016-05-19 — End: 2016-08-11

## 2016-05-26 ENCOUNTER — Ambulatory Visit: Payer: Medicare Other | Attending: Family Medicine

## 2016-05-26 DIAGNOSIS — L89314 Pressure ulcer of right buttock, stage 4: Secondary | ICD-10-CM | POA: Insufficient documentation

## 2016-05-26 DIAGNOSIS — G809 Cerebral palsy, unspecified: Secondary | ICD-10-CM | POA: Insufficient documentation

## 2016-05-26 DIAGNOSIS — Z993 Dependence on wheelchair: Secondary | ICD-10-CM | POA: Insufficient documentation

## 2016-05-26 DIAGNOSIS — Z87440 Personal history of urinary (tract) infections: Secondary | ICD-10-CM | POA: Insufficient documentation

## 2016-05-26 DIAGNOSIS — L89893 Pressure ulcer of other site, stage 3: Secondary | ICD-10-CM | POA: Insufficient documentation

## 2016-05-26 DIAGNOSIS — M1990 Unspecified osteoarthritis, unspecified site: Secondary | ICD-10-CM | POA: Insufficient documentation

## 2016-05-26 DIAGNOSIS — G629 Polyneuropathy, unspecified: Secondary | ICD-10-CM | POA: Insufficient documentation

## 2016-05-26 DIAGNOSIS — G609 Hereditary and idiopathic neuropathy, unspecified: Secondary | ICD-10-CM | POA: Insufficient documentation

## 2016-06-08 ENCOUNTER — Ambulatory Visit (INDEPENDENT_AMBULATORY_CARE_PROVIDER_SITE_OTHER): Payer: Self-pay | Admitting: Family Medicine

## 2016-06-09 ENCOUNTER — Ambulatory Visit: Payer: Medicare Other | Attending: Family Medicine

## 2016-06-09 ENCOUNTER — Other Ambulatory Visit: Payer: Self-pay | Admitting: Family Medicine

## 2016-06-09 DIAGNOSIS — L89314 Pressure ulcer of right buttock, stage 4: Secondary | ICD-10-CM | POA: Insufficient documentation

## 2016-06-09 DIAGNOSIS — L89154 Pressure ulcer of sacral region, stage 4: Secondary | ICD-10-CM

## 2016-06-09 DIAGNOSIS — G609 Hereditary and idiopathic neuropathy, unspecified: Secondary | ICD-10-CM | POA: Insufficient documentation

## 2016-06-09 DIAGNOSIS — G809 Cerebral palsy, unspecified: Secondary | ICD-10-CM | POA: Insufficient documentation

## 2016-06-09 DIAGNOSIS — L89893 Pressure ulcer of other site, stage 3: Secondary | ICD-10-CM | POA: Insufficient documentation

## 2016-06-09 DIAGNOSIS — M1990 Unspecified osteoarthritis, unspecified site: Secondary | ICD-10-CM | POA: Insufficient documentation

## 2016-06-09 MED ORDER — VENELEX EX OINT
TOPICAL_OINTMENT | CUTANEOUS | 2 refills | Status: AC | PRN
Start: 2016-06-09 — End: 2016-07-07

## 2016-06-16 ENCOUNTER — Ambulatory Visit (INDEPENDENT_AMBULATORY_CARE_PROVIDER_SITE_OTHER): Payer: Self-pay | Admitting: Family Medicine

## 2016-06-23 ENCOUNTER — Ambulatory Visit: Payer: Medicare Other | Attending: Family Medicine

## 2016-06-23 DIAGNOSIS — G809 Cerebral palsy, unspecified: Secondary | ICD-10-CM | POA: Insufficient documentation

## 2016-06-23 DIAGNOSIS — M1990 Unspecified osteoarthritis, unspecified site: Secondary | ICD-10-CM | POA: Insufficient documentation

## 2016-06-23 DIAGNOSIS — L89893 Pressure ulcer of other site, stage 3: Secondary | ICD-10-CM | POA: Insufficient documentation

## 2016-06-23 DIAGNOSIS — G609 Hereditary and idiopathic neuropathy, unspecified: Secondary | ICD-10-CM | POA: Insufficient documentation

## 2016-06-23 DIAGNOSIS — L89314 Pressure ulcer of right buttock, stage 4: Secondary | ICD-10-CM

## 2016-06-23 DIAGNOSIS — Z87440 Personal history of urinary (tract) infections: Secondary | ICD-10-CM | POA: Insufficient documentation

## 2016-07-07 ENCOUNTER — Ambulatory Visit: Payer: Medicare Other | Attending: Family Medicine

## 2016-07-07 DIAGNOSIS — Z87440 Personal history of urinary (tract) infections: Secondary | ICD-10-CM | POA: Insufficient documentation

## 2016-07-07 DIAGNOSIS — L89893 Pressure ulcer of other site, stage 3: Secondary | ICD-10-CM | POA: Insufficient documentation

## 2016-07-07 DIAGNOSIS — G809 Cerebral palsy, unspecified: Secondary | ICD-10-CM | POA: Insufficient documentation

## 2016-07-07 DIAGNOSIS — M1990 Unspecified osteoarthritis, unspecified site: Secondary | ICD-10-CM | POA: Insufficient documentation

## 2016-07-07 DIAGNOSIS — G609 Hereditary and idiopathic neuropathy, unspecified: Secondary | ICD-10-CM | POA: Insufficient documentation

## 2016-07-07 DIAGNOSIS — L89314 Pressure ulcer of right buttock, stage 4: Secondary | ICD-10-CM | POA: Insufficient documentation

## 2016-07-21 ENCOUNTER — Ambulatory Visit: Payer: Medicare Other | Attending: Family Medicine

## 2016-07-21 DIAGNOSIS — L89314 Pressure ulcer of right buttock, stage 4: Secondary | ICD-10-CM | POA: Insufficient documentation

## 2016-07-21 DIAGNOSIS — N390 Urinary tract infection, site not specified: Secondary | ICD-10-CM | POA: Insufficient documentation

## 2016-07-21 DIAGNOSIS — M1990 Unspecified osteoarthritis, unspecified site: Secondary | ICD-10-CM | POA: Insufficient documentation

## 2016-07-21 DIAGNOSIS — L89893 Pressure ulcer of other site, stage 3: Secondary | ICD-10-CM | POA: Insufficient documentation

## 2016-07-21 DIAGNOSIS — G809 Cerebral palsy, unspecified: Secondary | ICD-10-CM | POA: Insufficient documentation

## 2016-07-21 DIAGNOSIS — G609 Hereditary and idiopathic neuropathy, unspecified: Secondary | ICD-10-CM | POA: Insufficient documentation

## 2016-08-11 ENCOUNTER — Other Ambulatory Visit: Payer: Self-pay | Admitting: Family Medicine

## 2016-08-11 ENCOUNTER — Ambulatory Visit: Payer: Medicare Other | Attending: Family Medicine

## 2016-08-11 DIAGNOSIS — G809 Cerebral palsy, unspecified: Secondary | ICD-10-CM | POA: Insufficient documentation

## 2016-08-11 DIAGNOSIS — R21 Rash and other nonspecific skin eruption: Secondary | ICD-10-CM

## 2016-08-11 DIAGNOSIS — L89314 Pressure ulcer of right buttock, stage 4: Secondary | ICD-10-CM | POA: Insufficient documentation

## 2016-08-11 DIAGNOSIS — G609 Hereditary and idiopathic neuropathy, unspecified: Secondary | ICD-10-CM | POA: Insufficient documentation

## 2016-08-11 DIAGNOSIS — M1990 Unspecified osteoarthritis, unspecified site: Secondary | ICD-10-CM | POA: Insufficient documentation

## 2016-08-11 DIAGNOSIS — L89893 Pressure ulcer of other site, stage 3: Secondary | ICD-10-CM | POA: Insufficient documentation

## 2016-08-11 MED ORDER — FLUOCINONIDE 0.05 % EX OINT
TOPICAL_OINTMENT | Freq: Two times a day (BID) | CUTANEOUS | 0 refills | Status: AC
Start: 2016-08-11 — End: 2016-08-25

## 2016-08-25 ENCOUNTER — Ambulatory Visit: Payer: Medicare Other

## 2016-09-04 ENCOUNTER — Ambulatory Visit: Payer: Medicare Other

## 2016-09-11 ENCOUNTER — Ambulatory Visit: Payer: Medicare Other | Attending: Family Medicine

## 2016-09-11 DIAGNOSIS — G809 Cerebral palsy, unspecified: Secondary | ICD-10-CM | POA: Insufficient documentation

## 2016-09-11 DIAGNOSIS — G609 Hereditary and idiopathic neuropathy, unspecified: Secondary | ICD-10-CM | POA: Insufficient documentation

## 2016-09-11 DIAGNOSIS — Z87440 Personal history of urinary (tract) infections: Secondary | ICD-10-CM | POA: Insufficient documentation

## 2016-09-11 DIAGNOSIS — L89314 Pressure ulcer of right buttock, stage 4: Secondary | ICD-10-CM | POA: Insufficient documentation

## 2016-09-11 DIAGNOSIS — M1990 Unspecified osteoarthritis, unspecified site: Secondary | ICD-10-CM | POA: Insufficient documentation

## 2016-09-11 DIAGNOSIS — L89893 Pressure ulcer of other site, stage 3: Secondary | ICD-10-CM | POA: Insufficient documentation

## 2016-10-02 ENCOUNTER — Ambulatory Visit: Payer: Medicare Other

## 2016-10-09 ENCOUNTER — Ambulatory Visit: Payer: Medicare Other | Attending: Family Medicine

## 2016-10-09 DIAGNOSIS — G809 Cerebral palsy, unspecified: Secondary | ICD-10-CM | POA: Insufficient documentation

## 2016-10-09 DIAGNOSIS — L89314 Pressure ulcer of right buttock, stage 4: Secondary | ICD-10-CM | POA: Insufficient documentation

## 2016-10-09 DIAGNOSIS — R21 Rash and other nonspecific skin eruption: Secondary | ICD-10-CM | POA: Insufficient documentation

## 2016-10-09 DIAGNOSIS — G609 Hereditary and idiopathic neuropathy, unspecified: Secondary | ICD-10-CM | POA: Insufficient documentation

## 2016-10-09 DIAGNOSIS — Z87440 Personal history of urinary (tract) infections: Secondary | ICD-10-CM | POA: Insufficient documentation

## 2016-10-09 DIAGNOSIS — G629 Polyneuropathy, unspecified: Secondary | ICD-10-CM | POA: Insufficient documentation

## 2016-10-09 DIAGNOSIS — L89893 Pressure ulcer of other site, stage 3: Secondary | ICD-10-CM | POA: Insufficient documentation

## 2016-10-09 DIAGNOSIS — M1990 Unspecified osteoarthritis, unspecified site: Secondary | ICD-10-CM | POA: Insufficient documentation

## 2016-11-06 ENCOUNTER — Other Ambulatory Visit: Payer: Self-pay | Admitting: Family Medicine

## 2016-11-06 ENCOUNTER — Ambulatory Visit: Payer: Medicare Other | Attending: Family Medicine

## 2016-11-06 DIAGNOSIS — L89893 Pressure ulcer of other site, stage 3: Secondary | ICD-10-CM | POA: Insufficient documentation

## 2016-11-06 DIAGNOSIS — L89154 Pressure ulcer of sacral region, stage 4: Secondary | ICD-10-CM

## 2016-11-06 DIAGNOSIS — G609 Hereditary and idiopathic neuropathy, unspecified: Secondary | ICD-10-CM | POA: Insufficient documentation

## 2016-11-06 DIAGNOSIS — G809 Cerebral palsy, unspecified: Secondary | ICD-10-CM | POA: Insufficient documentation

## 2016-11-06 DIAGNOSIS — Z87440 Personal history of urinary (tract) infections: Secondary | ICD-10-CM | POA: Insufficient documentation

## 2016-11-06 DIAGNOSIS — L89314 Pressure ulcer of right buttock, stage 4: Secondary | ICD-10-CM | POA: Insufficient documentation

## 2016-11-06 DIAGNOSIS — M1990 Unspecified osteoarthritis, unspecified site: Secondary | ICD-10-CM | POA: Insufficient documentation

## 2016-11-06 MED ORDER — VENELEX EX OINT
TOPICAL_OINTMENT | Freq: Every day | CUTANEOUS | 3 refills | Status: AC | PRN
Start: 2016-11-06 — End: 2016-11-20

## 2016-12-04 ENCOUNTER — Ambulatory Visit: Payer: Medicare Other | Attending: Family Medicine

## 2016-12-04 DIAGNOSIS — G809 Cerebral palsy, unspecified: Secondary | ICD-10-CM | POA: Insufficient documentation

## 2016-12-04 DIAGNOSIS — L89314 Pressure ulcer of right buttock, stage 4: Secondary | ICD-10-CM | POA: Insufficient documentation

## 2016-12-04 DIAGNOSIS — L89893 Pressure ulcer of other site, stage 3: Secondary | ICD-10-CM | POA: Insufficient documentation

## 2016-12-04 DIAGNOSIS — Z87440 Personal history of urinary (tract) infections: Secondary | ICD-10-CM | POA: Insufficient documentation

## 2016-12-04 DIAGNOSIS — G609 Hereditary and idiopathic neuropathy, unspecified: Secondary | ICD-10-CM | POA: Insufficient documentation

## 2016-12-04 DIAGNOSIS — M1990 Unspecified osteoarthritis, unspecified site: Secondary | ICD-10-CM | POA: Insufficient documentation

## 2016-12-25 ENCOUNTER — Ambulatory Visit: Payer: Medicare Other | Attending: Family Medicine

## 2016-12-25 DIAGNOSIS — G609 Hereditary and idiopathic neuropathy, unspecified: Secondary | ICD-10-CM | POA: Insufficient documentation

## 2016-12-25 DIAGNOSIS — G809 Cerebral palsy, unspecified: Secondary | ICD-10-CM | POA: Insufficient documentation

## 2016-12-25 DIAGNOSIS — L89893 Pressure ulcer of other site, stage 3: Secondary | ICD-10-CM | POA: Insufficient documentation

## 2016-12-25 DIAGNOSIS — Z87440 Personal history of urinary (tract) infections: Secondary | ICD-10-CM | POA: Insufficient documentation

## 2016-12-25 DIAGNOSIS — M1990 Unspecified osteoarthritis, unspecified site: Secondary | ICD-10-CM | POA: Insufficient documentation

## 2017-01-01 ENCOUNTER — Ambulatory Visit: Payer: Medicare Other

## 2017-01-08 ENCOUNTER — Ambulatory Visit: Payer: Medicare Other

## 2017-01-22 ENCOUNTER — Ambulatory Visit: Payer: Medicare Other | Attending: Family Medicine

## 2017-01-22 DIAGNOSIS — L89893 Pressure ulcer of other site, stage 3: Secondary | ICD-10-CM | POA: Insufficient documentation

## 2017-01-22 DIAGNOSIS — L98491 Non-pressure chronic ulcer of skin of other sites limited to breakdown of skin: Secondary | ICD-10-CM

## 2017-01-22 DIAGNOSIS — Z87440 Personal history of urinary (tract) infections: Secondary | ICD-10-CM | POA: Insufficient documentation

## 2017-01-22 DIAGNOSIS — M1990 Unspecified osteoarthritis, unspecified site: Secondary | ICD-10-CM | POA: Insufficient documentation

## 2017-01-22 DIAGNOSIS — G809 Cerebral palsy, unspecified: Secondary | ICD-10-CM | POA: Insufficient documentation

## 2017-01-22 DIAGNOSIS — G609 Hereditary and idiopathic neuropathy, unspecified: Secondary | ICD-10-CM | POA: Insufficient documentation

## 2017-01-29 ENCOUNTER — Ambulatory Visit: Payer: Medicare Other | Attending: Family Medicine

## 2017-01-29 ENCOUNTER — Other Ambulatory Visit: Payer: Self-pay | Admitting: Plastic Surgery

## 2017-01-29 DIAGNOSIS — IMO0002 Reserved for concepts with insufficient information to code with codable children: Secondary | ICD-10-CM

## 2017-01-29 DIAGNOSIS — Z87440 Personal history of urinary (tract) infections: Secondary | ICD-10-CM | POA: Insufficient documentation

## 2017-01-29 DIAGNOSIS — L98491 Non-pressure chronic ulcer of skin of other sites limited to breakdown of skin: Secondary | ICD-10-CM

## 2017-01-29 DIAGNOSIS — L89893 Pressure ulcer of other site, stage 3: Secondary | ICD-10-CM | POA: Insufficient documentation

## 2017-01-29 DIAGNOSIS — M1990 Unspecified osteoarthritis, unspecified site: Secondary | ICD-10-CM | POA: Insufficient documentation

## 2017-01-29 DIAGNOSIS — G8250 Quadriplegia, unspecified: Secondary | ICD-10-CM | POA: Insufficient documentation

## 2017-01-29 MED ORDER — SULFAMETHOXAZOLE-TRIMETHOPRIM 400-80 MG PO TABS
2.0000 | ORAL_TABLET | Freq: Two times a day (BID) | ORAL | 0 refills | Status: AC
Start: 2017-01-29 — End: 2017-02-08

## 2017-01-29 MED ORDER — DAKINS (1/4 STRENGTH) 0.125 % EX SOLN
Freq: Every day | CUTANEOUS | 0 refills | Status: DC
Start: 2017-01-29 — End: 2018-03-24

## 2017-01-29 MED ORDER — CIPROFLOXACIN HCL 500 MG PO TABS: 500.0000 mg | ORAL_TABLET | Freq: Two times a day (BID) | ORAL | 0 refills | Status: AC

## 2017-02-12 ENCOUNTER — Other Ambulatory Visit: Payer: Self-pay | Admitting: Family Medicine

## 2017-02-12 ENCOUNTER — Ambulatory Visit: Payer: Medicare Other | Attending: Family Medicine

## 2017-02-12 DIAGNOSIS — Z87440 Personal history of urinary (tract) infections: Secondary | ICD-10-CM | POA: Insufficient documentation

## 2017-02-12 DIAGNOSIS — L89893 Pressure ulcer of other site, stage 3: Secondary | ICD-10-CM | POA: Insufficient documentation

## 2017-02-12 DIAGNOSIS — B49 Unspecified mycosis: Secondary | ICD-10-CM

## 2017-02-12 DIAGNOSIS — G609 Hereditary and idiopathic neuropathy, unspecified: Secondary | ICD-10-CM | POA: Insufficient documentation

## 2017-02-12 DIAGNOSIS — G809 Cerebral palsy, unspecified: Secondary | ICD-10-CM | POA: Insufficient documentation

## 2017-02-12 DIAGNOSIS — M1990 Unspecified osteoarthritis, unspecified site: Secondary | ICD-10-CM | POA: Insufficient documentation

## 2017-02-12 MED ORDER — CLOTRIMAZOLE-BETAMETHASONE 1-0.05 % EX CREA
TOPICAL_CREAM | Freq: Two times a day (BID) | CUTANEOUS | 1 refills | Status: AC
Start: 2017-02-12 — End: 2017-02-26

## 2017-02-26 ENCOUNTER — Ambulatory Visit: Payer: Medicare Other | Attending: Family Medicine

## 2017-02-26 DIAGNOSIS — L98491 Non-pressure chronic ulcer of skin of other sites limited to breakdown of skin: Secondary | ICD-10-CM

## 2017-02-26 DIAGNOSIS — L89893 Pressure ulcer of other site, stage 3: Secondary | ICD-10-CM | POA: Insufficient documentation

## 2017-02-26 DIAGNOSIS — G609 Hereditary and idiopathic neuropathy, unspecified: Secondary | ICD-10-CM | POA: Insufficient documentation

## 2017-02-26 DIAGNOSIS — Z87440 Personal history of urinary (tract) infections: Secondary | ICD-10-CM | POA: Insufficient documentation

## 2017-02-26 DIAGNOSIS — M1990 Unspecified osteoarthritis, unspecified site: Secondary | ICD-10-CM | POA: Insufficient documentation

## 2017-02-26 DIAGNOSIS — G809 Cerebral palsy, unspecified: Secondary | ICD-10-CM | POA: Insufficient documentation

## 2017-03-12 ENCOUNTER — Ambulatory Visit: Payer: Medicare Other | Attending: Family Medicine

## 2017-03-12 DIAGNOSIS — B368 Other specified superficial mycoses: Secondary | ICD-10-CM | POA: Insufficient documentation

## 2017-03-12 DIAGNOSIS — L98491 Non-pressure chronic ulcer of skin of other sites limited to breakdown of skin: Secondary | ICD-10-CM

## 2017-03-12 DIAGNOSIS — Z87440 Personal history of urinary (tract) infections: Secondary | ICD-10-CM | POA: Insufficient documentation

## 2017-03-12 DIAGNOSIS — G609 Hereditary and idiopathic neuropathy, unspecified: Secondary | ICD-10-CM | POA: Insufficient documentation

## 2017-03-12 DIAGNOSIS — M1990 Unspecified osteoarthritis, unspecified site: Secondary | ICD-10-CM | POA: Insufficient documentation

## 2017-03-12 DIAGNOSIS — L89893 Pressure ulcer of other site, stage 3: Secondary | ICD-10-CM | POA: Insufficient documentation

## 2017-03-12 DIAGNOSIS — G809 Cerebral palsy, unspecified: Secondary | ICD-10-CM | POA: Insufficient documentation

## 2017-03-12 DIAGNOSIS — L905 Scar conditions and fibrosis of skin: Secondary | ICD-10-CM | POA: Insufficient documentation

## 2017-03-19 ENCOUNTER — Ambulatory Visit: Payer: Medicare Other

## 2017-04-02 ENCOUNTER — Ambulatory Visit: Payer: Medicare Other | Attending: Family Medicine

## 2017-04-02 DIAGNOSIS — L98491 Non-pressure chronic ulcer of skin of other sites limited to breakdown of skin: Secondary | ICD-10-CM

## 2017-04-02 DIAGNOSIS — L89893 Pressure ulcer of other site, stage 3: Secondary | ICD-10-CM | POA: Insufficient documentation

## 2017-04-02 DIAGNOSIS — G609 Hereditary and idiopathic neuropathy, unspecified: Secondary | ICD-10-CM | POA: Insufficient documentation

## 2017-04-02 DIAGNOSIS — G809 Cerebral palsy, unspecified: Secondary | ICD-10-CM | POA: Insufficient documentation

## 2017-04-02 DIAGNOSIS — G8250 Quadriplegia, unspecified: Secondary | ICD-10-CM | POA: Insufficient documentation

## 2017-04-02 DIAGNOSIS — M1990 Unspecified osteoarthritis, unspecified site: Secondary | ICD-10-CM | POA: Insufficient documentation

## 2017-04-23 ENCOUNTER — Ambulatory Visit: Payer: Medicare Other | Attending: Family Medicine

## 2017-04-23 DIAGNOSIS — G809 Cerebral palsy, unspecified: Secondary | ICD-10-CM | POA: Insufficient documentation

## 2017-04-23 DIAGNOSIS — Z87440 Personal history of urinary (tract) infections: Secondary | ICD-10-CM | POA: Insufficient documentation

## 2017-04-23 DIAGNOSIS — L89894 Pressure ulcer of other site, stage 4: Secondary | ICD-10-CM | POA: Insufficient documentation

## 2017-04-23 DIAGNOSIS — M1990 Unspecified osteoarthritis, unspecified site: Secondary | ICD-10-CM | POA: Insufficient documentation

## 2017-04-23 DIAGNOSIS — G609 Hereditary and idiopathic neuropathy, unspecified: Secondary | ICD-10-CM | POA: Insufficient documentation

## 2017-05-14 ENCOUNTER — Ambulatory Visit: Payer: Medicare Other | Attending: Family Medicine

## 2017-05-14 DIAGNOSIS — G609 Hereditary and idiopathic neuropathy, unspecified: Secondary | ICD-10-CM | POA: Insufficient documentation

## 2017-05-14 DIAGNOSIS — L98491 Non-pressure chronic ulcer of skin of other sites limited to breakdown of skin: Secondary | ICD-10-CM

## 2017-05-14 DIAGNOSIS — Z87440 Personal history of urinary (tract) infections: Secondary | ICD-10-CM | POA: Insufficient documentation

## 2017-05-14 DIAGNOSIS — L89890 Pressure ulcer of other site, unstageable: Secondary | ICD-10-CM | POA: Insufficient documentation

## 2017-05-14 DIAGNOSIS — G809 Cerebral palsy, unspecified: Secondary | ICD-10-CM | POA: Insufficient documentation

## 2017-05-14 DIAGNOSIS — L89894 Pressure ulcer of other site, stage 4: Secondary | ICD-10-CM | POA: Insufficient documentation

## 2017-05-14 DIAGNOSIS — M1990 Unspecified osteoarthritis, unspecified site: Secondary | ICD-10-CM | POA: Insufficient documentation

## 2017-06-04 ENCOUNTER — Ambulatory Visit: Payer: Medicare Other | Attending: Plastic Surgery

## 2017-06-04 DIAGNOSIS — Z87440 Personal history of urinary (tract) infections: Secondary | ICD-10-CM | POA: Insufficient documentation

## 2017-06-04 DIAGNOSIS — M1990 Unspecified osteoarthritis, unspecified site: Secondary | ICD-10-CM | POA: Insufficient documentation

## 2017-06-04 DIAGNOSIS — L89894 Pressure ulcer of other site, stage 4: Secondary | ICD-10-CM | POA: Insufficient documentation

## 2017-06-04 DIAGNOSIS — G809 Cerebral palsy, unspecified: Secondary | ICD-10-CM | POA: Insufficient documentation

## 2017-06-04 DIAGNOSIS — G609 Hereditary and idiopathic neuropathy, unspecified: Secondary | ICD-10-CM | POA: Insufficient documentation

## 2017-06-04 DIAGNOSIS — L89890 Pressure ulcer of other site, unstageable: Secondary | ICD-10-CM | POA: Insufficient documentation

## 2017-06-25 ENCOUNTER — Ambulatory Visit: Payer: Medicare Other | Attending: Plastic Surgery

## 2017-06-25 ENCOUNTER — Ambulatory Visit: Payer: Self-pay

## 2017-06-25 DIAGNOSIS — L98499 Non-pressure chronic ulcer of skin of other sites with unspecified severity: Secondary | ICD-10-CM

## 2017-06-25 DIAGNOSIS — G809 Cerebral palsy, unspecified: Secondary | ICD-10-CM | POA: Insufficient documentation

## 2017-06-25 DIAGNOSIS — G609 Hereditary and idiopathic neuropathy, unspecified: Secondary | ICD-10-CM | POA: Insufficient documentation

## 2017-06-25 DIAGNOSIS — L89899 Pressure ulcer of other site, unspecified stage: Secondary | ICD-10-CM | POA: Insufficient documentation

## 2017-06-25 DIAGNOSIS — L89894 Pressure ulcer of other site, stage 4: Secondary | ICD-10-CM | POA: Insufficient documentation

## 2017-06-25 DIAGNOSIS — M1990 Unspecified osteoarthritis, unspecified site: Secondary | ICD-10-CM | POA: Insufficient documentation

## 2017-06-25 DIAGNOSIS — R238 Other skin changes: Secondary | ICD-10-CM

## 2017-06-25 DIAGNOSIS — L98491 Non-pressure chronic ulcer of skin of other sites limited to breakdown of skin: Secondary | ICD-10-CM

## 2017-06-25 DIAGNOSIS — N390 Urinary tract infection, site not specified: Secondary | ICD-10-CM | POA: Insufficient documentation

## 2017-06-25 DIAGNOSIS — L858 Other specified epidermal thickening: Secondary | ICD-10-CM

## 2017-06-27 LAB — LAB USE ONLY - HISTORICAL SURGICAL PATHOLOGY

## 2017-07-09 ENCOUNTER — Ambulatory Visit: Payer: Medicare Other | Attending: Plastic Surgery

## 2017-07-09 DIAGNOSIS — S31109A Unspecified open wound of abdominal wall, unspecified quadrant without penetration into peritoneal cavity, initial encounter: Secondary | ICD-10-CM | POA: Insufficient documentation

## 2017-07-09 DIAGNOSIS — G609 Hereditary and idiopathic neuropathy, unspecified: Secondary | ICD-10-CM | POA: Insufficient documentation

## 2017-07-09 DIAGNOSIS — L89890 Pressure ulcer of other site, unstageable: Secondary | ICD-10-CM | POA: Insufficient documentation

## 2017-07-09 DIAGNOSIS — G809 Cerebral palsy, unspecified: Secondary | ICD-10-CM | POA: Insufficient documentation

## 2017-07-09 DIAGNOSIS — Z87440 Personal history of urinary (tract) infections: Secondary | ICD-10-CM | POA: Insufficient documentation

## 2017-07-09 DIAGNOSIS — T8189XA Other complications of procedures, not elsewhere classified, initial encounter: Secondary | ICD-10-CM | POA: Insufficient documentation

## 2017-07-09 DIAGNOSIS — L98491 Non-pressure chronic ulcer of skin of other sites limited to breakdown of skin: Secondary | ICD-10-CM

## 2017-07-09 DIAGNOSIS — M1990 Unspecified osteoarthritis, unspecified site: Secondary | ICD-10-CM | POA: Insufficient documentation

## 2017-07-09 DIAGNOSIS — L89321 Pressure ulcer of left buttock, stage 1: Secondary | ICD-10-CM | POA: Insufficient documentation

## 2017-07-10 ENCOUNTER — Ambulatory Visit: Payer: Medicare Other

## 2017-07-16 ENCOUNTER — Ambulatory Visit: Payer: Medicare Other | Attending: Plastic Surgery

## 2017-07-16 DIAGNOSIS — T8189XA Other complications of procedures, not elsewhere classified, initial encounter: Secondary | ICD-10-CM | POA: Insufficient documentation

## 2017-07-16 DIAGNOSIS — G809 Cerebral palsy, unspecified: Secondary | ICD-10-CM | POA: Insufficient documentation

## 2017-07-16 DIAGNOSIS — Z87440 Personal history of urinary (tract) infections: Secondary | ICD-10-CM | POA: Insufficient documentation

## 2017-07-16 DIAGNOSIS — M1990 Unspecified osteoarthritis, unspecified site: Secondary | ICD-10-CM | POA: Insufficient documentation

## 2017-07-16 DIAGNOSIS — L89222 Pressure ulcer of left hip, stage 2: Secondary | ICD-10-CM

## 2017-07-16 DIAGNOSIS — L89321 Pressure ulcer of left buttock, stage 1: Secondary | ICD-10-CM | POA: Insufficient documentation

## 2017-07-16 DIAGNOSIS — G609 Hereditary and idiopathic neuropathy, unspecified: Secondary | ICD-10-CM | POA: Insufficient documentation

## 2017-07-16 DIAGNOSIS — Y838 Other surgical procedures as the cause of abnormal reaction of the patient, or of later complication, without mention of misadventure at the time of the procedure: Secondary | ICD-10-CM | POA: Insufficient documentation

## 2017-07-16 NOTE — Addendum Note (Signed)
Addended by: Dani Gobble on: 07/16/2017 02:32 PM     Modules accepted: Orders

## 2017-07-19 ENCOUNTER — Other Ambulatory Visit: Payer: Self-pay | Admitting: Family

## 2017-07-19 MED ORDER — DOXYCYCLINE HYCLATE 100 MG PO TABS
100.0000 mg | ORAL_TABLET | Freq: Two times a day (BID) | ORAL | 0 refills | Status: AC
Start: 2017-07-19 — End: 2017-08-02

## 2017-07-30 ENCOUNTER — Ambulatory Visit: Payer: Medicare Other | Attending: Plastic Surgery

## 2017-07-30 DIAGNOSIS — T8189XA Other complications of procedures, not elsewhere classified, initial encounter: Secondary | ICD-10-CM | POA: Insufficient documentation

## 2017-07-30 DIAGNOSIS — Z87440 Personal history of urinary (tract) infections: Secondary | ICD-10-CM | POA: Insufficient documentation

## 2017-07-30 DIAGNOSIS — S31109A Unspecified open wound of abdominal wall, unspecified quadrant without penetration into peritoneal cavity, initial encounter: Secondary | ICD-10-CM | POA: Insufficient documentation

## 2017-07-30 DIAGNOSIS — Y838 Other surgical procedures as the cause of abnormal reaction of the patient, or of later complication, without mention of misadventure at the time of the procedure: Secondary | ICD-10-CM | POA: Insufficient documentation

## 2017-07-30 DIAGNOSIS — M1990 Unspecified osteoarthritis, unspecified site: Secondary | ICD-10-CM | POA: Insufficient documentation

## 2017-07-30 DIAGNOSIS — G609 Hereditary and idiopathic neuropathy, unspecified: Secondary | ICD-10-CM | POA: Insufficient documentation

## 2017-07-30 DIAGNOSIS — L89894 Pressure ulcer of other site, stage 4: Secondary | ICD-10-CM | POA: Insufficient documentation

## 2017-07-30 DIAGNOSIS — G809 Cerebral palsy, unspecified: Secondary | ICD-10-CM | POA: Insufficient documentation

## 2017-07-30 DIAGNOSIS — L89321 Pressure ulcer of left buttock, stage 1: Secondary | ICD-10-CM | POA: Insufficient documentation

## 2017-07-30 DIAGNOSIS — L98491 Non-pressure chronic ulcer of skin of other sites limited to breakdown of skin: Secondary | ICD-10-CM

## 2017-08-03 ENCOUNTER — Other Ambulatory Visit: Payer: Self-pay | Admitting: Family

## 2017-08-03 MED ORDER — GENTAMICIN SULFATE 0.1 % EX OINT
TOPICAL_OINTMENT | Freq: Every day | CUTANEOUS | 1 refills | Status: AC
Start: 2017-08-03 — End: 2017-09-02

## 2017-08-13 ENCOUNTER — Ambulatory Visit: Payer: Medicare Other | Attending: Plastic Surgery

## 2017-08-13 DIAGNOSIS — G609 Hereditary and idiopathic neuropathy, unspecified: Secondary | ICD-10-CM | POA: Insufficient documentation

## 2017-08-13 DIAGNOSIS — L89894 Pressure ulcer of other site, stage 4: Secondary | ICD-10-CM | POA: Insufficient documentation

## 2017-08-13 DIAGNOSIS — Z87440 Personal history of urinary (tract) infections: Secondary | ICD-10-CM | POA: Insufficient documentation

## 2017-08-13 DIAGNOSIS — M1990 Unspecified osteoarthritis, unspecified site: Secondary | ICD-10-CM | POA: Insufficient documentation

## 2017-08-13 DIAGNOSIS — L98499 Non-pressure chronic ulcer of skin of other sites with unspecified severity: Secondary | ICD-10-CM | POA: Insufficient documentation

## 2017-08-13 DIAGNOSIS — L89222 Pressure ulcer of left hip, stage 2: Secondary | ICD-10-CM

## 2017-08-13 DIAGNOSIS — G809 Cerebral palsy, unspecified: Secondary | ICD-10-CM | POA: Insufficient documentation

## 2017-08-13 DIAGNOSIS — L89321 Pressure ulcer of left buttock, stage 1: Secondary | ICD-10-CM | POA: Insufficient documentation

## 2017-09-03 ENCOUNTER — Other Ambulatory Visit: Payer: Self-pay | Admitting: Plastic Surgery

## 2017-09-03 ENCOUNTER — Ambulatory Visit: Payer: Medicare Other | Attending: Plastic Surgery

## 2017-09-03 DIAGNOSIS — L89894 Pressure ulcer of other site, stage 4: Secondary | ICD-10-CM | POA: Insufficient documentation

## 2017-09-03 DIAGNOSIS — Z87440 Personal history of urinary (tract) infections: Secondary | ICD-10-CM | POA: Insufficient documentation

## 2017-09-03 DIAGNOSIS — L89222 Pressure ulcer of left hip, stage 2: Secondary | ICD-10-CM

## 2017-09-03 DIAGNOSIS — G809 Cerebral palsy, unspecified: Secondary | ICD-10-CM | POA: Insufficient documentation

## 2017-09-03 DIAGNOSIS — G8250 Quadriplegia, unspecified: Secondary | ICD-10-CM | POA: Insufficient documentation

## 2017-09-03 DIAGNOSIS — L98491 Non-pressure chronic ulcer of skin of other sites limited to breakdown of skin: Secondary | ICD-10-CM

## 2017-09-03 DIAGNOSIS — G609 Hereditary and idiopathic neuropathy, unspecified: Secondary | ICD-10-CM | POA: Insufficient documentation

## 2017-09-03 DIAGNOSIS — M1990 Unspecified osteoarthritis, unspecified site: Secondary | ICD-10-CM | POA: Insufficient documentation

## 2017-09-03 DIAGNOSIS — L89321 Pressure ulcer of left buttock, stage 1: Secondary | ICD-10-CM | POA: Insufficient documentation

## 2017-09-03 MED ORDER — VENELEX EX OINT
TOPICAL_OINTMENT | CUTANEOUS | 1 refills | Status: DC | PRN
Start: 2017-09-03 — End: 2018-03-24

## 2017-09-17 ENCOUNTER — Ambulatory Visit: Payer: Medicare Other | Attending: Plastic Surgery

## 2017-09-17 DIAGNOSIS — M1990 Unspecified osteoarthritis, unspecified site: Secondary | ICD-10-CM | POA: Insufficient documentation

## 2017-09-17 DIAGNOSIS — G809 Cerebral palsy, unspecified: Secondary | ICD-10-CM | POA: Insufficient documentation

## 2017-09-17 DIAGNOSIS — G8250 Quadriplegia, unspecified: Secondary | ICD-10-CM | POA: Insufficient documentation

## 2017-09-17 DIAGNOSIS — L89222 Pressure ulcer of left hip, stage 2: Secondary | ICD-10-CM

## 2017-09-17 DIAGNOSIS — Z87440 Personal history of urinary (tract) infections: Secondary | ICD-10-CM | POA: Insufficient documentation

## 2017-09-17 DIAGNOSIS — L89321 Pressure ulcer of left buttock, stage 1: Secondary | ICD-10-CM | POA: Insufficient documentation

## 2017-09-17 DIAGNOSIS — G609 Hereditary and idiopathic neuropathy, unspecified: Secondary | ICD-10-CM | POA: Insufficient documentation

## 2017-10-01 ENCOUNTER — Other Ambulatory Visit: Payer: Self-pay | Admitting: Plastic Surgery

## 2017-10-01 ENCOUNTER — Ambulatory Visit: Payer: Medicare Other | Attending: Plastic Surgery

## 2017-10-01 DIAGNOSIS — M1990 Unspecified osteoarthritis, unspecified site: Secondary | ICD-10-CM | POA: Insufficient documentation

## 2017-10-01 DIAGNOSIS — Z87440 Personal history of urinary (tract) infections: Secondary | ICD-10-CM | POA: Insufficient documentation

## 2017-10-01 DIAGNOSIS — G8250 Quadriplegia, unspecified: Secondary | ICD-10-CM | POA: Insufficient documentation

## 2017-10-01 DIAGNOSIS — G609 Hereditary and idiopathic neuropathy, unspecified: Secondary | ICD-10-CM | POA: Insufficient documentation

## 2017-10-01 DIAGNOSIS — Y838 Other surgical procedures as the cause of abnormal reaction of the patient, or of later complication, without mention of misadventure at the time of the procedure: Secondary | ICD-10-CM | POA: Insufficient documentation

## 2017-10-01 DIAGNOSIS — G809 Cerebral palsy, unspecified: Secondary | ICD-10-CM | POA: Insufficient documentation

## 2017-10-01 DIAGNOSIS — L97122 Non-pressure chronic ulcer of left thigh with fat layer exposed: Secondary | ICD-10-CM

## 2017-10-01 DIAGNOSIS — L89322 Pressure ulcer of left buttock, stage 2: Secondary | ICD-10-CM | POA: Insufficient documentation

## 2017-10-01 DIAGNOSIS — L89222 Pressure ulcer of left hip, stage 2: Secondary | ICD-10-CM

## 2017-10-01 MED ORDER — GENTAMICIN SULFATE 0.1 % EX OINT
TOPICAL_OINTMENT | Freq: Every day | CUTANEOUS | 0 refills | Status: DC
Start: 2017-10-01 — End: 2018-03-24

## 2017-10-09 LAB — CULTURE + GRAM STAIN,AEROBIC, WOUND

## 2017-10-22 ENCOUNTER — Ambulatory Visit: Payer: Medicare Other | Attending: Plastic Surgery

## 2017-10-22 DIAGNOSIS — M1990 Unspecified osteoarthritis, unspecified site: Secondary | ICD-10-CM | POA: Insufficient documentation

## 2017-10-22 DIAGNOSIS — G809 Cerebral palsy, unspecified: Secondary | ICD-10-CM | POA: Insufficient documentation

## 2017-10-22 DIAGNOSIS — Z87440 Personal history of urinary (tract) infections: Secondary | ICD-10-CM | POA: Insufficient documentation

## 2017-10-22 DIAGNOSIS — G609 Hereditary and idiopathic neuropathy, unspecified: Secondary | ICD-10-CM | POA: Insufficient documentation

## 2017-10-22 DIAGNOSIS — L89322 Pressure ulcer of left buttock, stage 2: Secondary | ICD-10-CM | POA: Insufficient documentation

## 2017-11-05 ENCOUNTER — Ambulatory Visit: Payer: Medicare Other | Attending: Plastic Surgery

## 2017-11-05 DIAGNOSIS — Z87440 Personal history of urinary (tract) infections: Secondary | ICD-10-CM | POA: Insufficient documentation

## 2017-11-05 DIAGNOSIS — M1990 Unspecified osteoarthritis, unspecified site: Secondary | ICD-10-CM | POA: Insufficient documentation

## 2017-11-05 DIAGNOSIS — G609 Hereditary and idiopathic neuropathy, unspecified: Secondary | ICD-10-CM | POA: Insufficient documentation

## 2017-11-05 DIAGNOSIS — G809 Cerebral palsy, unspecified: Secondary | ICD-10-CM | POA: Insufficient documentation

## 2017-11-05 DIAGNOSIS — L89322 Pressure ulcer of left buttock, stage 2: Secondary | ICD-10-CM | POA: Insufficient documentation

## 2017-11-26 ENCOUNTER — Ambulatory Visit: Payer: Medicare Other | Attending: Plastic Surgery

## 2017-11-26 DIAGNOSIS — L89322 Pressure ulcer of left buttock, stage 2: Secondary | ICD-10-CM | POA: Insufficient documentation

## 2017-11-26 DIAGNOSIS — L89222 Pressure ulcer of left hip, stage 2: Secondary | ICD-10-CM

## 2017-11-26 DIAGNOSIS — G809 Cerebral palsy, unspecified: Secondary | ICD-10-CM | POA: Insufficient documentation

## 2017-11-26 DIAGNOSIS — M1990 Unspecified osteoarthritis, unspecified site: Secondary | ICD-10-CM | POA: Insufficient documentation

## 2017-11-26 DIAGNOSIS — Z87440 Personal history of urinary (tract) infections: Secondary | ICD-10-CM | POA: Insufficient documentation

## 2017-11-26 DIAGNOSIS — G609 Hereditary and idiopathic neuropathy, unspecified: Secondary | ICD-10-CM | POA: Insufficient documentation

## 2017-11-26 DIAGNOSIS — G8250 Quadriplegia, unspecified: Secondary | ICD-10-CM | POA: Insufficient documentation

## 2017-12-17 ENCOUNTER — Other Ambulatory Visit: Payer: Self-pay | Admitting: Family

## 2017-12-17 ENCOUNTER — Ambulatory Visit: Payer: Medicare Other | Attending: Plastic Surgery | Admitting: Family

## 2017-12-17 DIAGNOSIS — L89323 Pressure ulcer of left buttock, stage 3: Secondary | ICD-10-CM | POA: Insufficient documentation

## 2017-12-17 DIAGNOSIS — M1990 Unspecified osteoarthritis, unspecified site: Secondary | ICD-10-CM | POA: Insufficient documentation

## 2017-12-17 DIAGNOSIS — L89222 Pressure ulcer of left hip, stage 2: Secondary | ICD-10-CM

## 2017-12-17 DIAGNOSIS — G809 Cerebral palsy, unspecified: Secondary | ICD-10-CM | POA: Insufficient documentation

## 2017-12-17 DIAGNOSIS — Z87440 Personal history of urinary (tract) infections: Secondary | ICD-10-CM | POA: Insufficient documentation

## 2017-12-17 DIAGNOSIS — G609 Hereditary and idiopathic neuropathy, unspecified: Secondary | ICD-10-CM | POA: Insufficient documentation

## 2017-12-17 MED ORDER — SULFAMETHOXAZOLE-TRIMETHOPRIM 800-160 MG PO TABS
1.0000 | ORAL_TABLET | Freq: Two times a day (BID) | ORAL | 0 refills | Status: DC
Start: 2017-12-17 — End: 2017-12-17

## 2017-12-17 MED ORDER — SULFAMETHOXAZOLE-TRIMETHOPRIM 800-160 MG PO TABS
1.0000 | ORAL_TABLET | Freq: Two times a day (BID) | ORAL | 0 refills | Status: DC
Start: 2017-12-17 — End: 2017-12-24

## 2017-12-24 ENCOUNTER — Other Ambulatory Visit: Payer: Self-pay | Admitting: Family

## 2017-12-24 ENCOUNTER — Ambulatory Visit: Payer: Medicare Other | Attending: Plastic Surgery

## 2017-12-24 ENCOUNTER — Other Ambulatory Visit: Payer: Self-pay | Admitting: Plastic Surgery

## 2017-12-24 DIAGNOSIS — L89322 Pressure ulcer of left buttock, stage 2: Secondary | ICD-10-CM | POA: Insufficient documentation

## 2017-12-24 DIAGNOSIS — L89222 Pressure ulcer of left hip, stage 2: Secondary | ICD-10-CM

## 2017-12-24 DIAGNOSIS — L98422 Non-pressure chronic ulcer of back with fat layer exposed: Secondary | ICD-10-CM

## 2017-12-24 MED ORDER — CLOTRIMAZOLE-BETAMETHASONE 1-0.05 % EX CREA
TOPICAL_CREAM | Freq: Every day | CUTANEOUS | 0 refills | Status: DC
Start: 2017-12-24 — End: 2018-03-24

## 2017-12-24 MED ORDER — CLOTRIMAZOLE-BETAMETHASONE 1-0.05 % EX CREA
TOPICAL_CREAM | Freq: Every day | CUTANEOUS | 0 refills | Status: DC
Start: 2017-12-24 — End: 2017-12-24

## 2017-12-24 MED ORDER — CIPROFLOXACIN HCL 500 MG PO TABS
500.0000 mg | ORAL_TABLET | Freq: Two times a day (BID) | ORAL | 0 refills | Status: AC
Start: 2017-12-24 — End: 2018-01-03

## 2017-12-24 MED ORDER — CIPROFLOXACIN HCL 500 MG PO TABS
500.0000 mg | ORAL_TABLET | Freq: Two times a day (BID) | ORAL | 0 refills | Status: DC
Start: 2017-12-24 — End: 2017-12-24

## 2018-01-07 ENCOUNTER — Ambulatory Visit: Payer: Medicare Other | Attending: Plastic Surgery

## 2018-01-07 DIAGNOSIS — L89323 Pressure ulcer of left buttock, stage 3: Secondary | ICD-10-CM | POA: Insufficient documentation

## 2018-01-07 DIAGNOSIS — Z87440 Personal history of urinary (tract) infections: Secondary | ICD-10-CM | POA: Insufficient documentation

## 2018-01-07 DIAGNOSIS — G809 Cerebral palsy, unspecified: Secondary | ICD-10-CM | POA: Insufficient documentation

## 2018-01-07 DIAGNOSIS — G609 Hereditary and idiopathic neuropathy, unspecified: Secondary | ICD-10-CM | POA: Insufficient documentation

## 2018-01-07 DIAGNOSIS — L89222 Pressure ulcer of left hip, stage 2: Secondary | ICD-10-CM

## 2018-01-07 DIAGNOSIS — M1990 Unspecified osteoarthritis, unspecified site: Secondary | ICD-10-CM | POA: Insufficient documentation

## 2018-01-14 ENCOUNTER — Ambulatory Visit: Payer: Medicare Other

## 2018-01-14 ENCOUNTER — Ambulatory Visit: Payer: Medicare Other | Attending: Plastic Surgery

## 2018-01-14 DIAGNOSIS — L89323 Pressure ulcer of left buttock, stage 3: Secondary | ICD-10-CM | POA: Insufficient documentation

## 2018-01-14 DIAGNOSIS — M1990 Unspecified osteoarthritis, unspecified site: Secondary | ICD-10-CM | POA: Insufficient documentation

## 2018-01-14 DIAGNOSIS — G609 Hereditary and idiopathic neuropathy, unspecified: Secondary | ICD-10-CM | POA: Insufficient documentation

## 2018-01-14 DIAGNOSIS — G809 Cerebral palsy, unspecified: Secondary | ICD-10-CM | POA: Insufficient documentation

## 2018-01-14 DIAGNOSIS — L89222 Pressure ulcer of left hip, stage 2: Secondary | ICD-10-CM

## 2018-01-14 DIAGNOSIS — Z87440 Personal history of urinary (tract) infections: Secondary | ICD-10-CM | POA: Insufficient documentation

## 2018-01-28 ENCOUNTER — Ambulatory Visit: Payer: Medicare Other

## 2018-02-04 ENCOUNTER — Ambulatory Visit: Payer: Medicare Other | Attending: Plastic Surgery

## 2018-02-04 DIAGNOSIS — G609 Hereditary and idiopathic neuropathy, unspecified: Secondary | ICD-10-CM | POA: Insufficient documentation

## 2018-02-04 DIAGNOSIS — L89222 Pressure ulcer of left hip, stage 2: Secondary | ICD-10-CM

## 2018-02-04 DIAGNOSIS — Z87440 Personal history of urinary (tract) infections: Secondary | ICD-10-CM | POA: Insufficient documentation

## 2018-02-04 DIAGNOSIS — G809 Cerebral palsy, unspecified: Secondary | ICD-10-CM | POA: Insufficient documentation

## 2018-02-04 DIAGNOSIS — L89323 Pressure ulcer of left buttock, stage 3: Secondary | ICD-10-CM | POA: Insufficient documentation

## 2018-02-04 DIAGNOSIS — M1990 Unspecified osteoarthritis, unspecified site: Secondary | ICD-10-CM | POA: Insufficient documentation

## 2018-02-25 ENCOUNTER — Ambulatory Visit: Payer: Medicare Other | Attending: Family Nurse Practitioner | Admitting: Family Nurse Practitioner

## 2018-02-25 DIAGNOSIS — G809 Cerebral palsy, unspecified: Secondary | ICD-10-CM | POA: Insufficient documentation

## 2018-02-25 DIAGNOSIS — G609 Hereditary and idiopathic neuropathy, unspecified: Secondary | ICD-10-CM | POA: Insufficient documentation

## 2018-02-25 DIAGNOSIS — M1990 Unspecified osteoarthritis, unspecified site: Secondary | ICD-10-CM | POA: Insufficient documentation

## 2018-02-25 DIAGNOSIS — L89321 Pressure ulcer of left buttock, stage 1: Secondary | ICD-10-CM | POA: Insufficient documentation

## 2018-02-25 DIAGNOSIS — Z87440 Personal history of urinary (tract) infections: Secondary | ICD-10-CM | POA: Insufficient documentation

## 2018-02-25 DIAGNOSIS — R52 Pain, unspecified: Secondary | ICD-10-CM

## 2018-02-25 DIAGNOSIS — L858 Other specified epidermal thickening: Secondary | ICD-10-CM

## 2018-02-25 DIAGNOSIS — L89222 Pressure ulcer of left hip, stage 2: Secondary | ICD-10-CM

## 2018-02-25 DIAGNOSIS — L89322 Pressure ulcer of left buttock, stage 2: Secondary | ICD-10-CM | POA: Insufficient documentation

## 2018-02-27 LAB — LAB USE ONLY - HISTORICAL SURGICAL PATHOLOGY

## 2018-03-04 ENCOUNTER — Ambulatory Visit: Payer: Medicare Other | Attending: Plastic Surgery

## 2018-03-04 DIAGNOSIS — G609 Hereditary and idiopathic neuropathy, unspecified: Secondary | ICD-10-CM | POA: Insufficient documentation

## 2018-03-04 DIAGNOSIS — S31829A Unspecified open wound of left buttock, initial encounter: Secondary | ICD-10-CM | POA: Insufficient documentation

## 2018-03-04 DIAGNOSIS — Y838 Other surgical procedures as the cause of abnormal reaction of the patient, or of later complication, without mention of misadventure at the time of the procedure: Secondary | ICD-10-CM | POA: Insufficient documentation

## 2018-03-04 DIAGNOSIS — G809 Cerebral palsy, unspecified: Secondary | ICD-10-CM | POA: Insufficient documentation

## 2018-03-04 DIAGNOSIS — Z87440 Personal history of urinary (tract) infections: Secondary | ICD-10-CM | POA: Insufficient documentation

## 2018-03-04 DIAGNOSIS — M1990 Unspecified osteoarthritis, unspecified site: Secondary | ICD-10-CM | POA: Insufficient documentation

## 2018-03-04 DIAGNOSIS — T8189XA Other complications of procedures, not elsewhere classified, initial encounter: Secondary | ICD-10-CM | POA: Insufficient documentation

## 2018-03-04 DIAGNOSIS — L89222 Pressure ulcer of left hip, stage 2: Secondary | ICD-10-CM

## 2018-03-24 ENCOUNTER — Emergency Department
Admission: EM | Admit: 2018-03-24 | Discharge: 2018-03-24 | Disposition: A | Discharge status: Home / Self Care | Payer: Medicare Other | Attending: Emergency Medical Services | Admitting: Emergency Medical Services

## 2018-03-24 DIAGNOSIS — L03317 Cellulitis of buttock: Secondary | ICD-10-CM | POA: Insufficient documentation

## 2018-03-24 DIAGNOSIS — L98429 Non-pressure chronic ulcer of back with unspecified severity: Secondary | ICD-10-CM

## 2018-03-24 DIAGNOSIS — G809 Cerebral palsy, unspecified: Secondary | ICD-10-CM | POA: Insufficient documentation

## 2018-03-24 DIAGNOSIS — L98419 Non-pressure chronic ulcer of buttock with unspecified severity: Secondary | ICD-10-CM | POA: Insufficient documentation

## 2018-03-24 LAB — CBC AND DIFFERENTIAL
Absolute NRBC: 0 10*3/uL (ref 0.00–0.00)
Basophils Absolute Automated: 0.05 10*3/uL (ref 0.00–0.08)
Basophils Automated: 0.9 %
Eosinophils Absolute Automated: 0.13 10*3/uL (ref 0.00–0.44)
Eosinophils Automated: 2.4 %
Hematocrit: 42.3 % (ref 34.7–43.7)
Hgb: 13.8 g/dL (ref 11.4–14.8)
Immature Granulocytes Absolute: 0.01 10*3/uL (ref 0.00–0.07)
Immature Granulocytes: 0.2 %
Lymphocytes Absolute Automated: 1.59 10*3/uL (ref 0.42–3.22)
Lymphocytes Automated: 29.1 %
MCH: 30.4 pg (ref 25.1–33.5)
MCHC: 32.6 g/dL (ref 31.5–35.8)
MCV: 93.2 fL (ref 78.0–96.0)
MPV: 9 fL (ref 8.9–12.5)
Monocytes Absolute Automated: 0.42 10*3/uL (ref 0.21–0.85)
Monocytes: 7.7 %
Neutrophils Absolute: 3.27 10*3/uL (ref 1.10–6.33)
Neutrophils: 59.7 %
Nucleated RBC: 0 /100 WBC (ref 0.0–0.0)
Platelets: 344 10*3/uL (ref 142–346)
RBC: 4.54 10*6/uL (ref 3.90–5.10)
RDW: 13 % (ref 11–15)
WBC: 5.47 10*3/uL (ref 3.10–9.50)

## 2018-03-24 LAB — BASIC METABOLIC PANEL
Anion Gap: 13 (ref 5.0–15.0)
BUN: 8 mg/dL (ref 7–19)
CO2: 18 mEq/L — ABNORMAL LOW (ref 22–29)
Calcium: 9.1 mg/dL (ref 8.5–10.5)
Chloride: 106 mEq/L (ref 100–111)
Creatinine: 0.6 mg/dL (ref 0.6–1.0)
Glucose: 85 mg/dL (ref 70–100)
Potassium: 4 mEq/L (ref 3.5–5.1)
Sodium: 137 mEq/L (ref 136–145)

## 2018-03-24 LAB — GFR: EGFR: >60

## 2018-03-24 MED ORDER — VANCOMYCIN 1000 MG IN 250 ML NS IVPB VIAL-MATE (CNR)
1000.00 mg | Freq: Once | INTRAVENOUS | Status: AC
Start: 2018-03-24 — End: 2018-03-24
  Administered 2018-03-24: 13:00:00 1000 mg via INTRAVENOUS
  Filled 2018-03-24: qty 250

## 2018-03-24 MED ORDER — FAMOTIDINE 10 MG/ML IV SOLN (WRAP)
20.00 mg | Freq: Once | INTRAVENOUS | Status: DC
Start: 2018-03-24 — End: 2018-03-24

## 2018-03-24 MED ORDER — SODIUM CHLORIDE 0.9 % IV MBP
4.50 g | Freq: Once | INTRAVENOUS | Status: AC
Start: 2018-03-24 — End: 2018-03-24
  Administered 2018-03-24: 12:00:00 4.5 g via INTRAVENOUS
  Filled 2018-03-24: qty 20

## 2018-03-24 MED ORDER — DIPHENHYDRAMINE HCL 50 MG/ML IJ SOLN
25.00 mg | Freq: Once | INTRAMUSCULAR | Status: DC
Start: 2018-03-24 — End: 2018-03-24
  Filled 2018-03-24: qty 1

## 2018-03-24 NOTE — ED Notes (Signed)
In to assess pt, mom prefers for me not to turn or undress pt. Sacral and wound exam deferred to provider

## 2018-03-24 NOTE — ED Notes (Signed)
2nd tech at bedside to assist with blood draw

## 2018-03-24 NOTE — ED Provider Notes (Signed)
Physician/Midlevel provider first contact with patient: 03/24/18 0955         History     Chief Complaint   Patient presents with   . Wound Infection   . Tailbone Pain     Patient with a history of cerebral palsy, quadriplegia, muscle spasm, depression, baclofen pump implantation, bladder surgery, wound flap closure of sacral ulcer s/p 4 weeks ago, presents with worsening pain, redness, odor, and drainage from surgical site x 2 days. Mother and patient note the wound was doing well up until 2 days ago. She notes a purulent discharge and blackish discoloration within the wound. She has staples and sutures in place and mother notes the wound has now opened up. No fever, chills, bodyaches, trauma, abd pain, N/V/D, or any other complaints. Pt not currently on abxs. She has an appt tomorrow with [facility] (Dr. Corinda Gubler) but came here today in case she needed IV abxs.     PCP:  Wound: Corinda Gubler  ID: none currently      The history is provided by the patient and a parent.            Past Medical History:   Diagnosis Date   . Cerebral palsy    . Depression    . Muscle spasm    . Neuromuscular disorder    . Quadriplegia congenital       Past Surgical History:   Procedure Laterality Date   . BACLOFEN PUMP IMPLANTATION     . BLADDER SURGERY     . CLOSURE, WOUND, FLAP     . HIP SURGERY         Family History   Problem Relation Age of Onset   . Hypertension Mother    . Diabetes Father        Social  Social History     Tobacco Use   . Smoking status: Never Smoker   . Smokeless tobacco: Never Used   Substance Use Topics   . Alcohol use: No   . Drug use: No       .     Allergies   Allergen Reactions   . Flu Virus Vaccine Swelling       Home Medications     Med List Status:  Pharmacy Completed Set By: Noralee Chars, RPH at 03/24/2018 12:47 PM                b complex vitamins capsule     Take 1 capsule by mouth daily.     FLUoxetine (PROZAC) 20 MG capsule     Take 20 mg by mouth daily     gabapentin (NEURONTIN) 300 MG capsule      Take 900 mg by mouth 2 (two) times daily        magnesium 30 MG tablet     Take 1,800 mg by mouth daily.Pt reports taking Magnesium Taurate.     Multiple Vitamin (MULTIVITAMIN) capsule     Take 1 capsule by mouth daily.     oxybutynin (DITROPAN-XL) 10 MG 24 hr tablet     Take 10 mg by mouth 2 (two) times daily.      topiramate (TOPAMAX) 50 MG tablet     Take 75 mg by mouth 2 (two) times daily     VITAMIN D, CHOLECALCIFEROL, PO     Take 2,000 Unit by mouth daily.  Review of Systems   All other systems reviewed and are negative.      Physical Exam    BP: 122/77, Heart Rate: 89, Temp: 98.8 F (37.1 C), Resp Rate: 16, SpO2: 94 %    Physical Exam  Vitals signs and nursing note reviewed.   Constitutional:       General: She is not in acute distress (contractured extremities, chronically ill appearing, alert, responds to questions, lips dry).     Appearance: She is well-developed.   HENT:      Head: Normocephalic and atraumatic.   Neck:      Musculoskeletal: Normal range of motion and neck supple.   Cardiovascular:      Rate and Rhythm: Normal rate and regular rhythm.      Heart sounds: Normal heart sounds. No murmur. No friction rub. No gallop.    Pulmonary:      Effort: Pulmonary effort is normal. No respiratory distress.      Breath sounds: Normal breath sounds. No wheezing or rales.   Abdominal:      General: Bowel sounds are normal. There is no distension.      Palpations: Abdomen is soft.      Tenderness: There is no tenderness.   Musculoskeletal: Normal range of motion.      Lumbar back: She exhibits laceration (Rt low back/upper buttock with large irregular wound with sutures and staples in place, 4cm region of wound open with blackish purulent malodoour ).        Back:    Skin:     General: Skin is warm and dry.   Neurological:      Mental Status: She is alert.           MDM and ED Course     ED Medication Orders (From  admission, onward)    Start Ordered     Status Ordering Provider    03/24/18 1339 03/24/18 1338  famotidine (PEPCID) injection 20 mg  Once     Route: Intravenous  Ordered Dose: 20 mg     Last MAR action:  Not Given Yaira Bernardi L    03/24/18 1337 03/24/18 1336  diphenhydrAMINE (BENADRYL) injection 25 mg  Once     Route: Intravenous  Ordered Dose: 25 mg     Last MAR action:  Not Given Vonnie Spagnolo L    03/24/18 1037 03/24/18 1036  vancomycin (VANCOCIN) 1000 mg in sodium chloride 0.9% 250 mL IVPB  Once in ED     Route: Intravenous  Ordered Dose: 1,000 mg     Last MAR action:  New Bag Oberia Beaudoin L    03/24/18 1037 03/24/18 1036  piperacillin-tazobactam (ZOSYN) 4.5 g in sodium chloride 0.9 % 100 mL IVPB mini-bag plus  Once     Route: Intravenous  Ordered Dose: 4.5 g     Last MAR action:  Stopped Lakyla Biswas L             MDM  Number of Diagnoses or Management Options  Cellulitis of buttock:   Skin ulcer of sacrum, unspecified ulcer stage:   Diagnosis management comments:   @10 :35am- spoke to Dr. Renata Caprice who will come see in the ED, recommends IV vancomycin and Zosyn and hold off on CT for now.     Mom expresses concerns for admission and missing her appt with [facility] tomorrow. She notes pt has been previously managed for another infected sacral ulcer by Wound healing in the past as an  outpatient with IV abxs and did not need admission. Pt looks well here, nontoxic, no fever and no leukocytosis. She did receive a dose of IV abxs per ID consultation. Spoke to ID over the phone regarding reservations for admission per mother and patient. She states if patient does look well and her labs normal, wound cx pending, she is okay with discharge and outpt f/u. Tomorrow    Re-eval: spoke with mother and patient and they both would prefer to have outpt mgt and f/u with Dr. Corinda Gubler tomorrow. They do not want admission today.     Laboratory results reviewed by ED provider with patient and/or family:  Yes     Re-eval: Reviewed all blood work results with patient and family. Discussed treatment and need for close outpatient follow up with their PMD and/or referrals provided today to discuss these findings.  All questions and concerns addressed. Strict return precautions given. Pt and/or family fully understand(s) and agrees to tx plan.    @1 :40pm- pt started getting redness in the face and neck, mild itching while getting the IV vancomycin. Pt has had this multiple times in the past without reaction. Will stop infusion and give Benadryl and pepcid IV.     Re-eval: mother notes sxs are improving after stopping infusion and they are declining benadryl and pepcid now. Will start re-infusion as slower speed    @2 :45pm- pt sitting comfortably in bed, her redness almost fully resolved. Getting IV vancomycin and 22 mins left on transfusion. Will d/c home after.                   Procedures    Clinical Impression & Disposition     Clinical Impression  Final diagnoses:   Skin ulcer of sacrum, unspecified ulcer stage   Cellulitis of buttock        ED Disposition     ED Disposition Condition Date/Time Comment    Discharge  Sun Mar 24, 2018 12:49 PM [name] discharge to home/self care.    Condition at disposition: Stable           New Prescriptions    No medications on file                 Christene Slates, [place]  03/24/18 1443

## 2018-03-24 NOTE — ED Notes (Signed)
Pt refused benadryl.pt's redness decreasing no itching at this time. Advised will wait a few minutes before restarting med.

## 2018-03-24 NOTE — ED Triage Notes (Signed)
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 Mother looked at area and it is black and bigger than it has ever been. A flap was created 2 weeks ago per mom and the wound looked great. The patient reports it started "smelling and turned black 2 days ago".

## 2018-03-24 NOTE — Discharge Instructions (Signed)
Dear [name] [name],    You were seen today by Josph Macho, PA-C. Thank you for choosing the [facility] for your healthcare needs.  We hope your visit today was EXCELLENT.    FOLLOW UP WITH YOUR PRIMARY DOCTOR AND DR. VERMA TOMORROW FOR REPEAT EVALUATION AND TMANAGEMENT OF INFECTED SACRAL ULCER. TAKE A COPY OF YOUR TEST RESULTS WITH YOU TO YOUR APPOINTMENT. RETURN TO THE ER IF YOU DEVELOP WORSENING SYMPTOMS, NEW CONCERNING SYMPTOMS, OR YOU ARE UNABLE TO SEE YOUR DOCTOR IN 2 DAYS.     Please take any medications prescribed as directed.     If you have any questions or concerns, I am available at [phone]. Please do not hesitate to contact me if I can be of assistance.     Below is some information and resources that our patients often find helpful.    Sincerely,    Josph Macho, Physician Assistant  [facility]    ________________________________________________________________  Thank you for choosing [facility] for your emergency care needs.  We strive to provide EXCELLENT care to you and your family.      If you do not continue to improve, your condition worsens, or you develop new concerning symptoms please contact your doctor or return immediately to the Emergency Department.    DOCTOR REFERRALS  Call [phone] (available 24 hours a day, 7 days a week) if you need any further referrals and we can help you find a primary care doctor or specialist.  Also, available online at:  [email or web address]    YOUR CONTACT INFORMATION  Before leaving please check with registration to make sure we have an up-to-date contact number.  You can call registration at [phone] to update your information.  For questions about your hospital bill, please call [phone].  For questions about your Emergency Dept Physician bill please call [phone].      FREE HEALTH SERVICES  If you need help with health  or social services, please call 2-1-1 for a free referral to resources in your area.  2-1-1 is a free service connecting people with information on health insurance, free clinics, pregnancy, mental health, dental care, food assistance, housing, and substance abuse counseling.  Also, available online at:  http://[email or web address].org    MEDICAL RECORDS AND TESTS  Certain laboratory test results do not come back the same day, for example urine cultures.  We will contact you if other important findings are noted. Radiology films are often reviewed again to ensure accuracy.  If there is any discrepancy, we will notify you.    Please call [phone] to pick up a complimentary CD of any radiology studies performed.  If you or your doctor would like to request a copy of your medical records, please call [phone].      ORTHOPEDIC INJURY   Please know that significant injuries can exist even when an initial x-ray is read as normal or negative.  This can occur because some fractures (broken bones) are not initially visible on x-rays or with soft tissue injuries.  For this reason, close outpatient follow-up with your primary care doctor or bone specialist (orthopedist) is required.    MEDICATIONS AND FOLLOWUP  Please be aware that some prescription medications can cause drowsiness.  Use caution when driving or operating machinery.    The examination and treatment you have received in our Emergency Department is provided on an emergency basis, and is  not intended to be a substitute for your primary care physician.  It is important that your doctor checks you again and that you report any new or remaining problems at that time.      [place], [place], [place] [address] (1.4 miles, 7 minutes)  [place], [place], [place] [address] (6.5 miles, 13 minutes)  Handout with directions available on request.

## 2018-03-24 NOTE — ED Notes (Signed)
Unable to obtain 2nd bld cx, provider advised. Ok start abx per PA Erskine Squibb

## 2018-03-24 NOTE — ED Notes (Signed)
Pt's mom called states pt c/o redness and itching. In to see pt. Pt assessed c/o red man syndrome. Vanco paused and provider informed. Pt states has had vanco before but not with this reaction

## 2018-03-24 NOTE — ED Notes (Signed)
Pt continued to improve. Provider reassessed pt continues to improve. Vanco restarted at 144ml/hr

## 2018-03-25 ENCOUNTER — Ambulatory Visit: Payer: Medicare Other | Attending: Plastic Surgery

## 2018-03-25 DIAGNOSIS — T8131XA Disruption of external operation (surgical) wound, not elsewhere classified, initial encounter: Secondary | ICD-10-CM | POA: Insufficient documentation

## 2018-03-25 DIAGNOSIS — Z87440 Personal history of urinary (tract) infections: Secondary | ICD-10-CM | POA: Insufficient documentation

## 2018-03-25 DIAGNOSIS — G609 Hereditary and idiopathic neuropathy, unspecified: Secondary | ICD-10-CM | POA: Insufficient documentation

## 2018-03-25 DIAGNOSIS — S31829A Unspecified open wound of left buttock, initial encounter: Secondary | ICD-10-CM | POA: Insufficient documentation

## 2018-03-25 DIAGNOSIS — M1990 Unspecified osteoarthritis, unspecified site: Secondary | ICD-10-CM | POA: Insufficient documentation

## 2018-03-25 DIAGNOSIS — L89222 Pressure ulcer of left hip, stage 2: Secondary | ICD-10-CM

## 2018-03-25 DIAGNOSIS — G809 Cerebral palsy, unspecified: Secondary | ICD-10-CM | POA: Insufficient documentation

## 2018-03-25 DIAGNOSIS — Y838 Other surgical procedures as the cause of abnormal reaction of the patient, or of later complication, without mention of misadventure at the time of the procedure: Secondary | ICD-10-CM | POA: Insufficient documentation

## 2018-03-25 MED ORDER — DOXYCYCLINE HYCLATE 100 MG PO TABS
100.00 mg | ORAL_TABLET | Freq: Two times a day (BID) | ORAL | 0 refills | Status: AC
Start: 2018-03-25 — End: 2018-04-08

## 2018-03-25 MED ORDER — DAKINS (1/4 STRENGTH) 0.125 % EX SOLN
Freq: Once | CUTANEOUS | 2 refills | Status: AC
Start: 2018-03-25 — End: 2018-03-25

## 2018-03-25 MED ORDER — CIPROFLOXACIN HCL 500 MG PO TABS: 500.0000 mg | ORAL_TABLET | Freq: Two times a day (BID) | ORAL | 0 refills | Status: AC

## 2018-03-25 NOTE — Progress Notes (Signed)
cipro and doxy x 2 weeks and Dakins ordered

## 2018-03-29 MED ORDER — CLINDAMYCIN HCL 300 MG PO CAPS
300.00 mg | ORAL_CAPSULE | Freq: Three times a day (TID) | ORAL | 0 refills | Status: AC
Start: 2018-03-29 — End: 2018-04-05

## 2018-04-01 ENCOUNTER — Ambulatory Visit: Payer: Medicare Other | Attending: Plastic Surgery

## 2018-04-01 DIAGNOSIS — Y838 Other surgical procedures as the cause of abnormal reaction of the patient, or of later complication, without mention of misadventure at the time of the procedure: Secondary | ICD-10-CM | POA: Insufficient documentation

## 2018-04-01 DIAGNOSIS — T8189XA Other complications of procedures, not elsewhere classified, initial encounter: Secondary | ICD-10-CM | POA: Insufficient documentation

## 2018-04-01 DIAGNOSIS — S31829A Unspecified open wound of left buttock, initial encounter: Secondary | ICD-10-CM | POA: Insufficient documentation

## 2018-04-01 DIAGNOSIS — G809 Cerebral palsy, unspecified: Secondary | ICD-10-CM | POA: Insufficient documentation

## 2018-04-01 DIAGNOSIS — G609 Hereditary and idiopathic neuropathy, unspecified: Secondary | ICD-10-CM | POA: Insufficient documentation

## 2018-04-01 DIAGNOSIS — X58XXXA Exposure to other specified factors, initial encounter: Secondary | ICD-10-CM | POA: Insufficient documentation

## 2018-04-01 DIAGNOSIS — L89222 Pressure ulcer of left hip, stage 2: Secondary | ICD-10-CM

## 2018-04-08 ENCOUNTER — Ambulatory Visit: Payer: Medicare Other

## 2018-04-15 ENCOUNTER — Ambulatory Visit: Payer: Medicare Other | Attending: Plastic Surgery | Admitting: Family Nurse Practitioner

## 2018-04-15 ENCOUNTER — Ambulatory Visit
Admission: RE | Admit: 2018-04-15 | Discharge: 2018-04-15 | Disposition: A | Discharge status: Home / Self Care | Payer: Medicare Other | Source: Ambulatory Visit | Attending: Family Nurse Practitioner | Admitting: Family Nurse Practitioner

## 2018-04-15 ENCOUNTER — Other Ambulatory Visit: Payer: Self-pay

## 2018-04-15 DIAGNOSIS — L98422 Non-pressure chronic ulcer of back with fat layer exposed: Secondary | ICD-10-CM | POA: Insufficient documentation

## 2018-04-15 DIAGNOSIS — M1990 Unspecified osteoarthritis, unspecified site: Secondary | ICD-10-CM | POA: Insufficient documentation

## 2018-04-15 DIAGNOSIS — G609 Hereditary and idiopathic neuropathy, unspecified: Secondary | ICD-10-CM | POA: Insufficient documentation

## 2018-04-15 DIAGNOSIS — T8189XA Other complications of procedures, not elsewhere classified, initial encounter: Secondary | ICD-10-CM | POA: Insufficient documentation

## 2018-04-15 DIAGNOSIS — L89323 Pressure ulcer of left buttock, stage 3: Secondary | ICD-10-CM | POA: Insufficient documentation

## 2018-04-15 DIAGNOSIS — G809 Cerebral palsy, unspecified: Secondary | ICD-10-CM | POA: Insufficient documentation

## 2018-04-15 DIAGNOSIS — Z87440 Personal history of urinary (tract) infections: Secondary | ICD-10-CM | POA: Insufficient documentation

## 2018-04-15 NOTE — Addendum Note (Signed)
Addended by: Dara Hoyer on: 04/15/2018 03:53 PM     Modules accepted: Orders

## 2018-04-22 ENCOUNTER — Ambulatory Visit: Payer: Medicare Other | Attending: Plastic Surgery

## 2018-04-22 DIAGNOSIS — Z87440 Personal history of urinary (tract) infections: Secondary | ICD-10-CM | POA: Insufficient documentation

## 2018-04-22 DIAGNOSIS — L89312 Pressure ulcer of right buttock, stage 2: Secondary | ICD-10-CM | POA: Insufficient documentation

## 2018-04-22 DIAGNOSIS — G609 Hereditary and idiopathic neuropathy, unspecified: Secondary | ICD-10-CM | POA: Insufficient documentation

## 2018-04-22 DIAGNOSIS — L89323 Pressure ulcer of left buttock, stage 3: Secondary | ICD-10-CM | POA: Insufficient documentation

## 2018-04-22 DIAGNOSIS — T8189XA Other complications of procedures, not elsewhere classified, initial encounter: Secondary | ICD-10-CM | POA: Insufficient documentation

## 2018-04-22 DIAGNOSIS — M1990 Unspecified osteoarthritis, unspecified site: Secondary | ICD-10-CM | POA: Insufficient documentation

## 2018-04-22 DIAGNOSIS — G809 Cerebral palsy, unspecified: Secondary | ICD-10-CM | POA: Insufficient documentation

## 2018-04-22 DIAGNOSIS — L89222 Pressure ulcer of left hip, stage 2: Secondary | ICD-10-CM

## 2018-04-29 ENCOUNTER — Ambulatory Visit: Payer: Medicare Other | Attending: Plastic Surgery

## 2018-04-29 DIAGNOSIS — L89313 Pressure ulcer of right buttock, stage 3: Secondary | ICD-10-CM | POA: Insufficient documentation

## 2018-04-29 DIAGNOSIS — L89323 Pressure ulcer of left buttock, stage 3: Secondary | ICD-10-CM | POA: Insufficient documentation

## 2018-04-29 DIAGNOSIS — G809 Cerebral palsy, unspecified: Secondary | ICD-10-CM | POA: Insufficient documentation

## 2018-04-29 DIAGNOSIS — G609 Hereditary and idiopathic neuropathy, unspecified: Secondary | ICD-10-CM | POA: Insufficient documentation

## 2018-04-29 DIAGNOSIS — L89222 Pressure ulcer of left hip, stage 2: Secondary | ICD-10-CM

## 2018-04-29 DIAGNOSIS — T8189XA Other complications of procedures, not elsewhere classified, initial encounter: Secondary | ICD-10-CM | POA: Insufficient documentation

## 2018-04-29 DIAGNOSIS — M1990 Unspecified osteoarthritis, unspecified site: Secondary | ICD-10-CM | POA: Insufficient documentation

## 2018-04-29 DIAGNOSIS — Z87440 Personal history of urinary (tract) infections: Secondary | ICD-10-CM | POA: Insufficient documentation

## 2018-05-06 ENCOUNTER — Ambulatory Visit: Payer: Medicare Other | Attending: Plastic Surgery

## 2018-05-06 DIAGNOSIS — T8189XA Other complications of procedures, not elsewhere classified, initial encounter: Secondary | ICD-10-CM | POA: Insufficient documentation

## 2018-05-06 DIAGNOSIS — M1990 Unspecified osteoarthritis, unspecified site: Secondary | ICD-10-CM | POA: Insufficient documentation

## 2018-05-06 DIAGNOSIS — L89222 Pressure ulcer of left hip, stage 2: Secondary | ICD-10-CM

## 2018-05-06 DIAGNOSIS — L89312 Pressure ulcer of right buttock, stage 2: Secondary | ICD-10-CM | POA: Insufficient documentation

## 2018-05-06 DIAGNOSIS — Z87440 Personal history of urinary (tract) infections: Secondary | ICD-10-CM | POA: Insufficient documentation

## 2018-05-06 DIAGNOSIS — G809 Cerebral palsy, unspecified: Secondary | ICD-10-CM | POA: Insufficient documentation

## 2018-05-06 DIAGNOSIS — G609 Hereditary and idiopathic neuropathy, unspecified: Secondary | ICD-10-CM | POA: Insufficient documentation

## 2018-05-06 DIAGNOSIS — L89323 Pressure ulcer of left buttock, stage 3: Secondary | ICD-10-CM | POA: Insufficient documentation

## 2018-05-10 ENCOUNTER — Ambulatory Visit: Payer: Medicare Other

## 2018-05-13 ENCOUNTER — Ambulatory Visit: Payer: Medicare Other | Attending: Plastic Surgery

## 2018-05-13 DIAGNOSIS — G809 Cerebral palsy, unspecified: Secondary | ICD-10-CM | POA: Insufficient documentation

## 2018-05-13 DIAGNOSIS — L89312 Pressure ulcer of right buttock, stage 2: Secondary | ICD-10-CM | POA: Insufficient documentation

## 2018-05-13 DIAGNOSIS — G8250 Quadriplegia, unspecified: Secondary | ICD-10-CM | POA: Insufficient documentation

## 2018-05-13 DIAGNOSIS — L89324 Pressure ulcer of left buttock, stage 4: Secondary | ICD-10-CM | POA: Insufficient documentation

## 2018-05-13 DIAGNOSIS — G609 Hereditary and idiopathic neuropathy, unspecified: Secondary | ICD-10-CM | POA: Insufficient documentation

## 2018-05-13 DIAGNOSIS — L89222 Pressure ulcer of left hip, stage 2: Secondary | ICD-10-CM

## 2018-05-20 ENCOUNTER — Ambulatory Visit: Payer: Medicare Other

## 2018-05-27 ENCOUNTER — Ambulatory Visit: Payer: Medicare Other | Attending: Plastic Surgery | Admitting: Family Nurse Practitioner

## 2018-05-27 DIAGNOSIS — L89312 Pressure ulcer of right buttock, stage 2: Secondary | ICD-10-CM | POA: Insufficient documentation

## 2018-05-27 DIAGNOSIS — L89222 Pressure ulcer of left hip, stage 2: Secondary | ICD-10-CM

## 2018-05-27 DIAGNOSIS — M1990 Unspecified osteoarthritis, unspecified site: Secondary | ICD-10-CM | POA: Insufficient documentation

## 2018-05-27 DIAGNOSIS — T8189XA Other complications of procedures, not elsewhere classified, initial encounter: Secondary | ICD-10-CM | POA: Insufficient documentation

## 2018-05-27 DIAGNOSIS — G609 Hereditary and idiopathic neuropathy, unspecified: Secondary | ICD-10-CM | POA: Insufficient documentation

## 2018-05-27 DIAGNOSIS — Z87440 Personal history of urinary (tract) infections: Secondary | ICD-10-CM | POA: Insufficient documentation

## 2018-05-27 DIAGNOSIS — G8250 Quadriplegia, unspecified: Secondary | ICD-10-CM | POA: Insufficient documentation

## 2018-05-27 DIAGNOSIS — L89324 Pressure ulcer of left buttock, stage 4: Secondary | ICD-10-CM | POA: Insufficient documentation

## 2018-05-27 DIAGNOSIS — G809 Cerebral palsy, unspecified: Secondary | ICD-10-CM | POA: Insufficient documentation

## 2018-05-29 ENCOUNTER — Encounter: Admission: RE | Payer: Self-pay | Source: Ambulatory Visit

## 2018-05-29 ENCOUNTER — Inpatient Hospital Stay: Admission: RE | Admit: 2018-05-29 | Payer: Medicare Other | Source: Ambulatory Visit | Admitting: Plastic Surgery

## 2018-05-29 SURGERY — DEBRIDEMENT & IRRIGATION, WOUND CLOSURE
Anesthesia: General | Site: Buttocks | Laterality: Left

## 2018-06-03 ENCOUNTER — Ambulatory Visit: Payer: Medicare Other | Attending: Plastic Surgery

## 2018-06-03 DIAGNOSIS — G809 Cerebral palsy, unspecified: Secondary | ICD-10-CM | POA: Insufficient documentation

## 2018-06-03 DIAGNOSIS — L89222 Pressure ulcer of left hip, stage 2: Secondary | ICD-10-CM

## 2018-06-03 DIAGNOSIS — M1990 Unspecified osteoarthritis, unspecified site: Secondary | ICD-10-CM | POA: Insufficient documentation

## 2018-06-03 DIAGNOSIS — T8189XA Other complications of procedures, not elsewhere classified, initial encounter: Secondary | ICD-10-CM | POA: Insufficient documentation

## 2018-06-03 DIAGNOSIS — L89324 Pressure ulcer of left buttock, stage 4: Secondary | ICD-10-CM | POA: Insufficient documentation

## 2018-06-03 DIAGNOSIS — G609 Hereditary and idiopathic neuropathy, unspecified: Secondary | ICD-10-CM | POA: Insufficient documentation

## 2018-06-03 DIAGNOSIS — Z87440 Personal history of urinary (tract) infections: Secondary | ICD-10-CM | POA: Insufficient documentation

## 2018-06-10 ENCOUNTER — Ambulatory Visit: Payer: Medicare Other | Attending: Plastic Surgery

## 2018-06-10 DIAGNOSIS — M1990 Unspecified osteoarthritis, unspecified site: Secondary | ICD-10-CM | POA: Insufficient documentation

## 2018-06-10 DIAGNOSIS — G8250 Quadriplegia, unspecified: Secondary | ICD-10-CM | POA: Insufficient documentation

## 2018-06-10 DIAGNOSIS — T8189XA Other complications of procedures, not elsewhere classified, initial encounter: Secondary | ICD-10-CM | POA: Insufficient documentation

## 2018-06-10 DIAGNOSIS — L89323 Pressure ulcer of left buttock, stage 3: Secondary | ICD-10-CM | POA: Insufficient documentation

## 2018-06-10 DIAGNOSIS — Z87440 Personal history of urinary (tract) infections: Secondary | ICD-10-CM | POA: Insufficient documentation

## 2018-06-10 DIAGNOSIS — G609 Hereditary and idiopathic neuropathy, unspecified: Secondary | ICD-10-CM | POA: Insufficient documentation

## 2018-06-10 DIAGNOSIS — L98491 Non-pressure chronic ulcer of skin of other sites limited to breakdown of skin: Secondary | ICD-10-CM | POA: Insufficient documentation

## 2018-06-17 ENCOUNTER — Ambulatory Visit: Payer: Medicare Other

## 2018-06-24 ENCOUNTER — Ambulatory Visit: Payer: Medicare Other | Attending: Plastic Surgery

## 2018-06-24 DIAGNOSIS — Z87440 Personal history of urinary (tract) infections: Secondary | ICD-10-CM | POA: Insufficient documentation

## 2018-06-24 DIAGNOSIS — T8189XA Other complications of procedures, not elsewhere classified, initial encounter: Secondary | ICD-10-CM | POA: Insufficient documentation

## 2018-06-24 DIAGNOSIS — G609 Hereditary and idiopathic neuropathy, unspecified: Secondary | ICD-10-CM | POA: Insufficient documentation

## 2018-06-24 DIAGNOSIS — M1990 Unspecified osteoarthritis, unspecified site: Secondary | ICD-10-CM | POA: Insufficient documentation

## 2018-06-24 DIAGNOSIS — G8250 Quadriplegia, unspecified: Secondary | ICD-10-CM | POA: Insufficient documentation

## 2018-06-24 DIAGNOSIS — L89222 Pressure ulcer of left hip, stage 2: Secondary | ICD-10-CM

## 2018-07-01 ENCOUNTER — Ambulatory Visit: Payer: Medicare Other

## 2018-07-15 ENCOUNTER — Other Ambulatory Visit: Payer: Self-pay | Admitting: Plastic Surgery

## 2018-07-15 ENCOUNTER — Ambulatory Visit: Payer: Medicare Other | Attending: Plastic Surgery

## 2018-07-15 DIAGNOSIS — T8189XA Other complications of procedures, not elsewhere classified, initial encounter: Secondary | ICD-10-CM | POA: Insufficient documentation

## 2018-07-15 DIAGNOSIS — G809 Cerebral palsy, unspecified: Secondary | ICD-10-CM | POA: Insufficient documentation

## 2018-07-15 DIAGNOSIS — L89324 Pressure ulcer of left buttock, stage 4: Secondary | ICD-10-CM | POA: Insufficient documentation

## 2018-07-15 DIAGNOSIS — Z87440 Personal history of urinary (tract) infections: Secondary | ICD-10-CM | POA: Insufficient documentation

## 2018-07-15 DIAGNOSIS — M1990 Unspecified osteoarthritis, unspecified site: Secondary | ICD-10-CM | POA: Insufficient documentation

## 2018-07-15 DIAGNOSIS — L89222 Pressure ulcer of left hip, stage 2: Secondary | ICD-10-CM

## 2018-07-15 DIAGNOSIS — Z8614 Personal history of Methicillin resistant Staphylococcus aureus infection: Secondary | ICD-10-CM | POA: Insufficient documentation

## 2018-07-15 MED ORDER — DAKINS (1/2 STRENGTH) 0.25 % EX SOLN
Freq: Every day | CUTANEOUS | 2 refills | Status: AC
Start: 2018-07-15 — End: ?

## 2018-07-15 MED ORDER — CIPROFLOXACIN HCL 500 MG PO TABS: 500.0000 mg | ORAL_TABLET | Freq: Two times a day (BID) | ORAL | 0 refills | Status: AC

## 2018-07-15 MED ORDER — SULFAMETHOXAZOLE-TRIMETHOPRIM 800-160 MG PO TABS
1.00 | ORAL_TABLET | Freq: Two times a day (BID) | ORAL | 0 refills | Status: DC
Start: 2018-07-15 — End: 2018-07-22

## 2018-07-15 NOTE — Progress Notes (Signed)
cipro and bactrim 2 week course prescribed, Dakin's prescribed

## 2018-07-15 NOTE — Addendum Note (Signed)
Addended by: Dani Gobble on: 07/15/2018 02:14 PM     Modules accepted: Orders

## 2018-07-22 ENCOUNTER — Other Ambulatory Visit: Payer: Self-pay | Admitting: Plastic Surgery

## 2018-07-22 ENCOUNTER — Ambulatory Visit: Payer: Medicare Other | Attending: Plastic Surgery

## 2018-07-22 DIAGNOSIS — M1990 Unspecified osteoarthritis, unspecified site: Secondary | ICD-10-CM | POA: Insufficient documentation

## 2018-07-22 DIAGNOSIS — G609 Hereditary and idiopathic neuropathy, unspecified: Secondary | ICD-10-CM | POA: Insufficient documentation

## 2018-07-22 DIAGNOSIS — L89222 Pressure ulcer of left hip, stage 2: Secondary | ICD-10-CM

## 2018-07-22 DIAGNOSIS — G8250 Quadriplegia, unspecified: Secondary | ICD-10-CM | POA: Insufficient documentation

## 2018-07-22 DIAGNOSIS — L89324 Pressure ulcer of left buttock, stage 4: Secondary | ICD-10-CM | POA: Insufficient documentation

## 2018-07-22 DIAGNOSIS — B9562 Methicillin resistant Staphylococcus aureus infection as the cause of diseases classified elsewhere: Secondary | ICD-10-CM | POA: Insufficient documentation

## 2018-07-22 DIAGNOSIS — Y838 Other surgical procedures as the cause of abnormal reaction of the patient, or of later complication, without mention of misadventure at the time of the procedure: Secondary | ICD-10-CM | POA: Insufficient documentation

## 2018-07-22 DIAGNOSIS — Z87440 Personal history of urinary (tract) infections: Secondary | ICD-10-CM | POA: Insufficient documentation

## 2018-07-22 DIAGNOSIS — T8189XA Other complications of procedures, not elsewhere classified, initial encounter: Secondary | ICD-10-CM | POA: Insufficient documentation

## 2018-07-22 MED ORDER — DOXYCYCLINE HYCLATE 100 MG PO TABS
100.00 mg | ORAL_TABLET | Freq: Two times a day (BID) | ORAL | 0 refills | Status: AC
Start: 2018-07-22 — End: 2018-08-12

## 2018-08-12 ENCOUNTER — Ambulatory Visit: Payer: Medicare Other | Attending: Plastic Surgery

## 2018-08-12 DIAGNOSIS — T8189XA Other complications of procedures, not elsewhere classified, initial encounter: Secondary | ICD-10-CM | POA: Insufficient documentation

## 2018-08-12 DIAGNOSIS — L89222 Pressure ulcer of left hip, stage 2: Secondary | ICD-10-CM

## 2018-08-12 DIAGNOSIS — Y848 Other medical procedures as the cause of abnormal reaction of the patient, or of later complication, without mention of misadventure at the time of the procedure: Secondary | ICD-10-CM | POA: Insufficient documentation

## 2018-08-12 DIAGNOSIS — Z8614 Personal history of Methicillin resistant Staphylococcus aureus infection: Secondary | ICD-10-CM | POA: Insufficient documentation

## 2018-08-12 DIAGNOSIS — G609 Hereditary and idiopathic neuropathy, unspecified: Secondary | ICD-10-CM | POA: Insufficient documentation

## 2018-08-12 DIAGNOSIS — Y834 Other reconstructive surgery as the cause of abnormal reaction of the patient, or of later complication, without mention of misadventure at the time of the procedure: Secondary | ICD-10-CM | POA: Insufficient documentation

## 2018-08-12 DIAGNOSIS — G809 Cerebral palsy, unspecified: Secondary | ICD-10-CM | POA: Insufficient documentation

## 2018-08-19 ENCOUNTER — Ambulatory Visit: Payer: Medicare Other

## 2018-08-26 ENCOUNTER — Ambulatory Visit: Payer: Medicare Other | Attending: Plastic Surgery | Admitting: Family Nurse Practitioner

## 2018-08-26 DIAGNOSIS — Z8614 Personal history of Methicillin resistant Staphylococcus aureus infection: Secondary | ICD-10-CM | POA: Insufficient documentation

## 2018-08-26 DIAGNOSIS — G629 Polyneuropathy, unspecified: Secondary | ICD-10-CM | POA: Insufficient documentation

## 2018-08-26 DIAGNOSIS — Y834 Other reconstructive surgery as the cause of abnormal reaction of the patient, or of later complication, without mention of misadventure at the time of the procedure: Secondary | ICD-10-CM | POA: Insufficient documentation

## 2018-08-26 DIAGNOSIS — M1990 Unspecified osteoarthritis, unspecified site: Secondary | ICD-10-CM | POA: Insufficient documentation

## 2018-08-26 DIAGNOSIS — L89222 Pressure ulcer of left hip, stage 2: Secondary | ICD-10-CM

## 2018-08-26 DIAGNOSIS — L89324 Pressure ulcer of left buttock, stage 4: Secondary | ICD-10-CM | POA: Insufficient documentation

## 2018-08-26 DIAGNOSIS — Y848 Other medical procedures as the cause of abnormal reaction of the patient, or of later complication, without mention of misadventure at the time of the procedure: Secondary | ICD-10-CM | POA: Insufficient documentation

## 2018-08-26 DIAGNOSIS — G609 Hereditary and idiopathic neuropathy, unspecified: Secondary | ICD-10-CM | POA: Insufficient documentation

## 2018-08-26 DIAGNOSIS — G809 Cerebral palsy, unspecified: Secondary | ICD-10-CM | POA: Insufficient documentation

## 2018-08-26 DIAGNOSIS — T8189XA Other complications of procedures, not elsewhere classified, initial encounter: Secondary | ICD-10-CM | POA: Insufficient documentation

## 2018-08-26 MED ORDER — VENELEX EX OINT
TOPICAL_OINTMENT | CUTANEOUS | 0 refills | Status: AC | PRN
Start: 2018-08-26 — End: ?

## 2018-09-02 ENCOUNTER — Ambulatory Visit: Payer: Medicare Other

## 2018-09-09 ENCOUNTER — Ambulatory Visit: Payer: Medicare Other | Attending: Plastic Surgery

## 2018-09-09 DIAGNOSIS — G609 Hereditary and idiopathic neuropathy, unspecified: Secondary | ICD-10-CM | POA: Insufficient documentation

## 2018-09-09 DIAGNOSIS — Z8614 Personal history of Methicillin resistant Staphylococcus aureus infection: Secondary | ICD-10-CM | POA: Insufficient documentation

## 2018-09-09 DIAGNOSIS — L89222 Pressure ulcer of left hip, stage 2: Secondary | ICD-10-CM

## 2018-09-09 DIAGNOSIS — T8189XA Other complications of procedures, not elsewhere classified, initial encounter: Secondary | ICD-10-CM | POA: Insufficient documentation

## 2018-09-09 DIAGNOSIS — L89324 Pressure ulcer of left buttock, stage 4: Secondary | ICD-10-CM | POA: Insufficient documentation

## 2018-09-09 DIAGNOSIS — Z87440 Personal history of urinary (tract) infections: Secondary | ICD-10-CM | POA: Insufficient documentation

## 2018-09-09 DIAGNOSIS — M1990 Unspecified osteoarthritis, unspecified site: Secondary | ICD-10-CM | POA: Insufficient documentation

## 2018-09-09 DIAGNOSIS — G809 Cerebral palsy, unspecified: Secondary | ICD-10-CM | POA: Insufficient documentation

## 2018-09-16 ENCOUNTER — Ambulatory Visit: Payer: Medicare Other

## 2018-09-23 ENCOUNTER — Ambulatory Visit: Payer: Medicare Other

## 2018-09-30 ENCOUNTER — Ambulatory Visit: Payer: Medicare Other | Attending: Plastic Surgery

## 2018-09-30 DIAGNOSIS — L89222 Pressure ulcer of left hip, stage 2: Secondary | ICD-10-CM

## 2018-10-14 ENCOUNTER — Ambulatory Visit: Payer: Medicare Other | Attending: Plastic Surgery

## 2018-10-14 ENCOUNTER — Ambulatory Visit: Payer: Medicare Other

## 2018-10-21 ENCOUNTER — Other Ambulatory Visit: Payer: Self-pay | Admitting: Plastic Surgery

## 2018-10-21 ENCOUNTER — Ambulatory Visit: Payer: Medicare Other | Attending: Plastic Surgery

## 2018-10-21 MED ORDER — TRIAMCINOLONE ACETONIDE 0.1 % EX OINT
TOPICAL_OINTMENT | Freq: Every day | CUTANEOUS | 0 refills | Status: AC
Start: 2018-10-21 — End: 2018-11-04

## 2018-11-04 ENCOUNTER — Ambulatory Visit: Payer: Medicare Other | Attending: Plastic Surgery

## 2018-11-18 ENCOUNTER — Ambulatory Visit: Payer: Medicare Other | Attending: Plastic Surgery

## 2018-12-02 ENCOUNTER — Ambulatory Visit: Payer: Medicare Other | Attending: Plastic Surgery

## 2018-12-09 ENCOUNTER — Ambulatory Visit: Payer: Self-pay

## 2018-12-16 ENCOUNTER — Ambulatory Visit: Payer: Medicare Other | Attending: Plastic Surgery

## 2018-12-16 ENCOUNTER — Ambulatory Visit: Payer: Self-pay

## 2018-12-23 ENCOUNTER — Ambulatory Visit: Payer: Medicare Other | Attending: Foot & Ankle Surgery

## 2018-12-23 ENCOUNTER — Other Ambulatory Visit: Payer: Self-pay | Admitting: Plastic Surgery

## 2018-12-23 DIAGNOSIS — Y834 Other reconstructive surgery as the cause of abnormal reaction of the patient, or of later complication, without mention of misadventure at the time of the procedure: Secondary | ICD-10-CM | POA: Insufficient documentation

## 2018-12-23 DIAGNOSIS — Z87440 Personal history of urinary (tract) infections: Secondary | ICD-10-CM | POA: Insufficient documentation

## 2018-12-23 DIAGNOSIS — G609 Hereditary and idiopathic neuropathy, unspecified: Secondary | ICD-10-CM | POA: Insufficient documentation

## 2018-12-23 DIAGNOSIS — Z8614 Personal history of Methicillin resistant Staphylococcus aureus infection: Secondary | ICD-10-CM | POA: Insufficient documentation

## 2018-12-23 DIAGNOSIS — L89222 Pressure ulcer of left hip, stage 2: Secondary | ICD-10-CM

## 2018-12-23 DIAGNOSIS — T8189XA Other complications of procedures, not elsewhere classified, initial encounter: Secondary | ICD-10-CM | POA: Insufficient documentation

## 2018-12-23 DIAGNOSIS — L89324 Pressure ulcer of left buttock, stage 4: Secondary | ICD-10-CM | POA: Insufficient documentation

## 2018-12-23 DIAGNOSIS — M1990 Unspecified osteoarthritis, unspecified site: Secondary | ICD-10-CM | POA: Insufficient documentation

## 2018-12-23 DIAGNOSIS — G809 Cerebral palsy, unspecified: Secondary | ICD-10-CM | POA: Insufficient documentation

## 2018-12-23 MED ORDER — GENTAMICIN SULFATE 0.1 % EX OINT
TOPICAL_OINTMENT | Freq: Every day | CUTANEOUS | 0 refills | Status: AC
Start: 2018-12-23 — End: ?

## 2018-12-23 NOTE — Progress Notes (Signed)
Gentamicin refilled

## 2019-01-06 ENCOUNTER — Other Ambulatory Visit: Payer: Self-pay | Admitting: Family Medicine

## 2019-01-06 ENCOUNTER — Ambulatory Visit: Payer: Medicare Other | Attending: Family Medicine

## 2019-01-06 MED ORDER — CLOTRIMAZOLE-BETAMETHASONE 1-0.05 % EX CREA
TOPICAL_CREAM | Freq: Every day | CUTANEOUS | 1 refills | Status: AC
Start: 2019-01-06 — End: 2019-02-05

## 2019-01-20 ENCOUNTER — Ambulatory Visit: Payer: Medicare Other

## 2019-01-27 ENCOUNTER — Ambulatory Visit: Payer: Medicare Other | Attending: Family Medicine

## 2019-01-27 DIAGNOSIS — Y834 Other reconstructive surgery as the cause of abnormal reaction of the patient, or of later complication, without mention of misadventure at the time of the procedure: Secondary | ICD-10-CM | POA: Insufficient documentation

## 2019-01-27 DIAGNOSIS — T8189XA Other complications of procedures, not elsewhere classified, initial encounter: Secondary | ICD-10-CM | POA: Insufficient documentation

## 2019-01-27 DIAGNOSIS — Z8614 Personal history of Methicillin resistant Staphylococcus aureus infection: Secondary | ICD-10-CM | POA: Insufficient documentation

## 2019-01-27 DIAGNOSIS — Z87440 Personal history of urinary (tract) infections: Secondary | ICD-10-CM | POA: Insufficient documentation

## 2019-01-27 DIAGNOSIS — G809 Cerebral palsy, unspecified: Secondary | ICD-10-CM | POA: Insufficient documentation

## 2019-01-27 DIAGNOSIS — L98412 Non-pressure chronic ulcer of buttock with fat layer exposed: Secondary | ICD-10-CM | POA: Insufficient documentation

## 2019-01-27 DIAGNOSIS — G609 Hereditary and idiopathic neuropathy, unspecified: Secondary | ICD-10-CM | POA: Insufficient documentation

## 2019-01-27 DIAGNOSIS — M1990 Unspecified osteoarthritis, unspecified site: Secondary | ICD-10-CM | POA: Insufficient documentation

## 2019-01-27 DIAGNOSIS — G8250 Quadriplegia, unspecified: Secondary | ICD-10-CM | POA: Insufficient documentation

## 2019-01-27 DIAGNOSIS — L89324 Pressure ulcer of left buttock, stage 4: Secondary | ICD-10-CM | POA: Insufficient documentation

## 2019-01-27 DIAGNOSIS — L89222 Pressure ulcer of left hip, stage 2: Secondary | ICD-10-CM

## 2019-02-03 ENCOUNTER — Ambulatory Visit: Payer: Self-pay

## 2019-02-17 ENCOUNTER — Ambulatory Visit: Payer: Medicare Other | Attending: Family Medicine

## 2019-02-17 DIAGNOSIS — M1990 Unspecified osteoarthritis, unspecified site: Secondary | ICD-10-CM | POA: Insufficient documentation

## 2019-02-17 DIAGNOSIS — Z8614 Personal history of Methicillin resistant Staphylococcus aureus infection: Secondary | ICD-10-CM | POA: Insufficient documentation

## 2019-02-17 DIAGNOSIS — G609 Hereditary and idiopathic neuropathy, unspecified: Secondary | ICD-10-CM | POA: Insufficient documentation

## 2019-02-17 DIAGNOSIS — Z87440 Personal history of urinary (tract) infections: Secondary | ICD-10-CM | POA: Insufficient documentation

## 2019-02-17 DIAGNOSIS — T8189XA Other complications of procedures, not elsewhere classified, initial encounter: Secondary | ICD-10-CM | POA: Insufficient documentation

## 2019-02-17 DIAGNOSIS — G809 Cerebral palsy, unspecified: Secondary | ICD-10-CM | POA: Insufficient documentation

## 2019-02-17 DIAGNOSIS — L98412 Non-pressure chronic ulcer of buttock with fat layer exposed: Secondary | ICD-10-CM | POA: Insufficient documentation

## 2019-03-03 ENCOUNTER — Ambulatory Visit: Payer: Medicare Other | Attending: Family Medicine

## 2019-03-03 DIAGNOSIS — Y838 Other surgical procedures as the cause of abnormal reaction of the patient, or of later complication, without mention of misadventure at the time of the procedure: Secondary | ICD-10-CM | POA: Insufficient documentation

## 2019-03-03 DIAGNOSIS — G8250 Quadriplegia, unspecified: Secondary | ICD-10-CM | POA: Insufficient documentation

## 2019-03-03 DIAGNOSIS — M1990 Unspecified osteoarthritis, unspecified site: Secondary | ICD-10-CM | POA: Insufficient documentation

## 2019-03-03 DIAGNOSIS — Z8614 Personal history of Methicillin resistant Staphylococcus aureus infection: Secondary | ICD-10-CM | POA: Insufficient documentation

## 2019-03-03 DIAGNOSIS — G609 Hereditary and idiopathic neuropathy, unspecified: Secondary | ICD-10-CM | POA: Insufficient documentation

## 2019-03-03 DIAGNOSIS — Z87440 Personal history of urinary (tract) infections: Secondary | ICD-10-CM | POA: Insufficient documentation

## 2019-03-03 DIAGNOSIS — L98412 Non-pressure chronic ulcer of buttock with fat layer exposed: Secondary | ICD-10-CM | POA: Insufficient documentation

## 2019-03-03 DIAGNOSIS — T8189XA Other complications of procedures, not elsewhere classified, initial encounter: Secondary | ICD-10-CM | POA: Insufficient documentation

## 2019-03-24 ENCOUNTER — Ambulatory Visit: Payer: Medicare Other | Attending: Family Medicine

## 2019-03-24 DIAGNOSIS — T8189XA Other complications of procedures, not elsewhere classified, initial encounter: Secondary | ICD-10-CM | POA: Insufficient documentation

## 2019-03-24 DIAGNOSIS — G809 Cerebral palsy, unspecified: Secondary | ICD-10-CM | POA: Insufficient documentation

## 2019-03-24 DIAGNOSIS — M1990 Unspecified osteoarthritis, unspecified site: Secondary | ICD-10-CM | POA: Insufficient documentation

## 2019-03-24 DIAGNOSIS — Y834 Other reconstructive surgery as the cause of abnormal reaction of the patient, or of later complication, without mention of misadventure at the time of the procedure: Secondary | ICD-10-CM | POA: Insufficient documentation

## 2019-03-24 DIAGNOSIS — Z8614 Personal history of Methicillin resistant Staphylococcus aureus infection: Secondary | ICD-10-CM | POA: Insufficient documentation

## 2019-03-24 DIAGNOSIS — L98412 Non-pressure chronic ulcer of buttock with fat layer exposed: Secondary | ICD-10-CM | POA: Insufficient documentation

## 2019-03-24 DIAGNOSIS — Z87440 Personal history of urinary (tract) infections: Secondary | ICD-10-CM | POA: Insufficient documentation

## 2019-03-24 DIAGNOSIS — G609 Hereditary and idiopathic neuropathy, unspecified: Secondary | ICD-10-CM | POA: Insufficient documentation

## 2019-04-14 ENCOUNTER — Ambulatory Visit: Payer: Medicare Other | Attending: Family Medicine

## 2019-04-14 DIAGNOSIS — M1990 Unspecified osteoarthritis, unspecified site: Secondary | ICD-10-CM | POA: Insufficient documentation

## 2019-04-14 DIAGNOSIS — Y834 Other reconstructive surgery as the cause of abnormal reaction of the patient, or of later complication, without mention of misadventure at the time of the procedure: Secondary | ICD-10-CM | POA: Insufficient documentation

## 2019-04-14 DIAGNOSIS — G809 Cerebral palsy, unspecified: Secondary | ICD-10-CM | POA: Insufficient documentation

## 2019-04-14 DIAGNOSIS — T8189XA Other complications of procedures, not elsewhere classified, initial encounter: Secondary | ICD-10-CM | POA: Insufficient documentation

## 2019-04-14 DIAGNOSIS — Z87440 Personal history of urinary (tract) infections: Secondary | ICD-10-CM | POA: Insufficient documentation

## 2019-04-14 DIAGNOSIS — G609 Hereditary and idiopathic neuropathy, unspecified: Secondary | ICD-10-CM | POA: Insufficient documentation

## 2019-04-14 DIAGNOSIS — L98412 Non-pressure chronic ulcer of buttock with fat layer exposed: Secondary | ICD-10-CM | POA: Insufficient documentation

## 2019-04-14 DIAGNOSIS — L89324 Pressure ulcer of left buttock, stage 4: Secondary | ICD-10-CM | POA: Insufficient documentation

## 2019-04-14 DIAGNOSIS — Z8614 Personal history of Methicillin resistant Staphylococcus aureus infection: Secondary | ICD-10-CM | POA: Insufficient documentation

## 2019-04-21 ENCOUNTER — Ambulatory Visit: Payer: Medicare Other | Attending: Family Medicine

## 2019-04-21 DIAGNOSIS — Y834 Other reconstructive surgery as the cause of abnormal reaction of the patient, or of later complication, without mention of misadventure at the time of the procedure: Secondary | ICD-10-CM | POA: Insufficient documentation

## 2019-04-21 DIAGNOSIS — T8189XA Other complications of procedures, not elsewhere classified, initial encounter: Secondary | ICD-10-CM | POA: Insufficient documentation

## 2019-04-21 DIAGNOSIS — Z8614 Personal history of Methicillin resistant Staphylococcus aureus infection: Secondary | ICD-10-CM | POA: Insufficient documentation

## 2019-04-21 DIAGNOSIS — L89324 Pressure ulcer of left buttock, stage 4: Secondary | ICD-10-CM | POA: Insufficient documentation

## 2019-04-21 DIAGNOSIS — L98411 Non-pressure chronic ulcer of buttock limited to breakdown of skin: Secondary | ICD-10-CM | POA: Insufficient documentation

## 2019-04-21 DIAGNOSIS — G609 Hereditary and idiopathic neuropathy, unspecified: Secondary | ICD-10-CM | POA: Insufficient documentation

## 2019-04-21 DIAGNOSIS — M1990 Unspecified osteoarthritis, unspecified site: Secondary | ICD-10-CM | POA: Insufficient documentation

## 2019-04-21 DIAGNOSIS — Z87440 Personal history of urinary (tract) infections: Secondary | ICD-10-CM | POA: Insufficient documentation

## 2019-04-21 DIAGNOSIS — G809 Cerebral palsy, unspecified: Secondary | ICD-10-CM | POA: Insufficient documentation

## 2019-04-28 ENCOUNTER — Ambulatory Visit: Payer: Medicare Other

## 2019-05-05 ENCOUNTER — Ambulatory Visit: Payer: Medicare Other | Attending: Family Medicine

## 2019-05-05 DIAGNOSIS — Z87440 Personal history of urinary (tract) infections: Secondary | ICD-10-CM | POA: Insufficient documentation

## 2019-05-05 DIAGNOSIS — G609 Hereditary and idiopathic neuropathy, unspecified: Secondary | ICD-10-CM | POA: Insufficient documentation

## 2019-05-05 DIAGNOSIS — T8189XA Other complications of procedures, not elsewhere classified, initial encounter: Secondary | ICD-10-CM | POA: Insufficient documentation

## 2019-05-05 DIAGNOSIS — G809 Cerebral palsy, unspecified: Secondary | ICD-10-CM | POA: Insufficient documentation

## 2019-05-05 DIAGNOSIS — L98412 Non-pressure chronic ulcer of buttock with fat layer exposed: Secondary | ICD-10-CM | POA: Insufficient documentation

## 2019-05-05 DIAGNOSIS — M1990 Unspecified osteoarthritis, unspecified site: Secondary | ICD-10-CM | POA: Insufficient documentation

## 2019-05-05 DIAGNOSIS — Y838 Other surgical procedures as the cause of abnormal reaction of the patient, or of later complication, without mention of misadventure at the time of the procedure: Secondary | ICD-10-CM | POA: Insufficient documentation

## 2019-05-05 DIAGNOSIS — Z8614 Personal history of Methicillin resistant Staphylococcus aureus infection: Secondary | ICD-10-CM | POA: Insufficient documentation

## 2019-05-19 ENCOUNTER — Ambulatory Visit: Payer: Medicare Other | Attending: Family Medicine

## 2019-05-19 DIAGNOSIS — G609 Hereditary and idiopathic neuropathy, unspecified: Secondary | ICD-10-CM | POA: Insufficient documentation

## 2019-05-19 DIAGNOSIS — M1990 Unspecified osteoarthritis, unspecified site: Secondary | ICD-10-CM | POA: Insufficient documentation

## 2019-05-19 DIAGNOSIS — T8189XA Other complications of procedures, not elsewhere classified, initial encounter: Secondary | ICD-10-CM | POA: Insufficient documentation

## 2019-05-19 DIAGNOSIS — Z8614 Personal history of Methicillin resistant Staphylococcus aureus infection: Secondary | ICD-10-CM | POA: Insufficient documentation

## 2019-05-19 DIAGNOSIS — G809 Cerebral palsy, unspecified: Secondary | ICD-10-CM | POA: Insufficient documentation

## 2019-05-19 DIAGNOSIS — Y838 Other surgical procedures as the cause of abnormal reaction of the patient, or of later complication, without mention of misadventure at the time of the procedure: Secondary | ICD-10-CM | POA: Insufficient documentation

## 2019-05-19 DIAGNOSIS — L98412 Non-pressure chronic ulcer of buttock with fat layer exposed: Secondary | ICD-10-CM | POA: Insufficient documentation

## 2019-05-19 DIAGNOSIS — Z87440 Personal history of urinary (tract) infections: Secondary | ICD-10-CM | POA: Insufficient documentation

## 2019-06-23 ENCOUNTER — Ambulatory Visit: Payer: Medicare (Managed Care) | Attending: Family Medicine

## 2019-06-23 DIAGNOSIS — G609 Hereditary and idiopathic neuropathy, unspecified: Secondary | ICD-10-CM | POA: Insufficient documentation

## 2019-06-23 DIAGNOSIS — G809 Cerebral palsy, unspecified: Secondary | ICD-10-CM | POA: Insufficient documentation

## 2019-06-23 DIAGNOSIS — M1990 Unspecified osteoarthritis, unspecified site: Secondary | ICD-10-CM | POA: Insufficient documentation

## 2019-06-23 DIAGNOSIS — Z87440 Personal history of urinary (tract) infections: Secondary | ICD-10-CM | POA: Insufficient documentation

## 2019-06-23 DIAGNOSIS — L89324 Pressure ulcer of left buttock, stage 4: Secondary | ICD-10-CM | POA: Insufficient documentation

## 2019-06-23 DIAGNOSIS — Y838 Other surgical procedures as the cause of abnormal reaction of the patient, or of later complication, without mention of misadventure at the time of the procedure: Secondary | ICD-10-CM | POA: Insufficient documentation

## 2019-07-21 ENCOUNTER — Ambulatory Visit: Payer: Medicare Other | Attending: Family Medicine

## 2019-07-21 DIAGNOSIS — Y838 Other surgical procedures as the cause of abnormal reaction of the patient, or of later complication, without mention of misadventure at the time of the procedure: Secondary | ICD-10-CM | POA: Insufficient documentation

## 2019-07-21 DIAGNOSIS — M1990 Unspecified osteoarthritis, unspecified site: Secondary | ICD-10-CM | POA: Insufficient documentation

## 2019-07-21 DIAGNOSIS — Z8614 Personal history of Methicillin resistant Staphylococcus aureus infection: Secondary | ICD-10-CM | POA: Insufficient documentation

## 2019-07-21 DIAGNOSIS — G808 Other cerebral palsy: Secondary | ICD-10-CM | POA: Insufficient documentation

## 2019-07-21 DIAGNOSIS — L98412 Non-pressure chronic ulcer of buttock with fat layer exposed: Secondary | ICD-10-CM | POA: Insufficient documentation

## 2019-07-21 DIAGNOSIS — G609 Hereditary and idiopathic neuropathy, unspecified: Secondary | ICD-10-CM | POA: Insufficient documentation

## 2019-07-21 DIAGNOSIS — T8189XA Other complications of procedures, not elsewhere classified, initial encounter: Secondary | ICD-10-CM | POA: Insufficient documentation

## 2019-09-15 ENCOUNTER — Ambulatory Visit: Payer: Medicare (Managed Care) | Attending: Family Medicine

## 2019-09-15 DIAGNOSIS — Z09 Encounter for follow-up examination after completed treatment for conditions other than malignant neoplasm: Secondary | ICD-10-CM | POA: Insufficient documentation

## 2019-09-15 DIAGNOSIS — Z872 Personal history of diseases of the skin and subcutaneous tissue: Secondary | ICD-10-CM | POA: Insufficient documentation

## 2019-12-08 ENCOUNTER — Ambulatory Visit: Payer: Medicare (Managed Care)

## 2020-01-20 ENCOUNTER — Ambulatory Visit: Payer: Medicare (Managed Care) | Attending: Family Medicine

## 2020-01-20 DIAGNOSIS — Y834 Other reconstructive surgery as the cause of abnormal reaction of the patient, or of later complication, without mention of misadventure at the time of the procedure: Secondary | ICD-10-CM

## 2020-01-20 DIAGNOSIS — T8189XA Other complications of procedures, not elsewhere classified, initial encounter: Secondary | ICD-10-CM

## 2020-01-20 DIAGNOSIS — Y848 Other medical procedures as the cause of abnormal reaction of the patient, or of later complication, without mention of misadventure at the time of the procedure: Secondary | ICD-10-CM

## 2020-01-20 DIAGNOSIS — Z87440 Personal history of urinary (tract) infections: Secondary | ICD-10-CM | POA: Insufficient documentation

## 2020-01-20 DIAGNOSIS — B9562 Methicillin resistant Staphylococcus aureus infection as the cause of diseases classified elsewhere: Secondary | ICD-10-CM

## 2020-01-20 DIAGNOSIS — G609 Hereditary and idiopathic neuropathy, unspecified: Secondary | ICD-10-CM | POA: Insufficient documentation

## 2020-01-20 DIAGNOSIS — Z8614 Personal history of Methicillin resistant Staphylococcus aureus infection: Secondary | ICD-10-CM | POA: Insufficient documentation

## 2020-01-20 DIAGNOSIS — M1990 Unspecified osteoarthritis, unspecified site: Secondary | ICD-10-CM | POA: Insufficient documentation

## 2020-01-20 DIAGNOSIS — L98412 Non-pressure chronic ulcer of buttock with fat layer exposed: Secondary | ICD-10-CM | POA: Insufficient documentation

## 2020-01-20 DIAGNOSIS — G808 Other cerebral palsy: Secondary | ICD-10-CM | POA: Insufficient documentation

## 2020-01-20 DIAGNOSIS — L89222 Pressure ulcer of left hip, stage 2: Secondary | ICD-10-CM

## 2020-02-04 ENCOUNTER — Ambulatory Visit: Payer: Medicare (Managed Care) | Attending: Physician Assistant

## 2020-02-04 DIAGNOSIS — L98418 Non-pressure chronic ulcer of buttock with other specified severity: Secondary | ICD-10-CM | POA: Insufficient documentation

## 2020-02-13 ENCOUNTER — Ambulatory Visit: Payer: Medicare (Managed Care)

## 2020-02-20 ENCOUNTER — Ambulatory Visit: Payer: Medicare (Managed Care) | Attending: Physician Assistant

## 2020-03-05 ENCOUNTER — Ambulatory Visit: Payer: Medicare (Managed Care) | Attending: Physician Assistant

## 2020-03-05 DIAGNOSIS — Y848 Other medical procedures as the cause of abnormal reaction of the patient, or of later complication, without mention of misadventure at the time of the procedure: Secondary | ICD-10-CM

## 2020-03-05 DIAGNOSIS — Y834 Other reconstructive surgery as the cause of abnormal reaction of the patient, or of later complication, without mention of misadventure at the time of the procedure: Secondary | ICD-10-CM

## 2020-03-05 DIAGNOSIS — L98412 Non-pressure chronic ulcer of buttock with fat layer exposed: Secondary | ICD-10-CM

## 2020-03-05 DIAGNOSIS — S80821A Blister (nonthermal), right lower leg, initial encounter: Secondary | ICD-10-CM

## 2020-03-25 ENCOUNTER — Ambulatory Visit: Payer: Medicare (Managed Care)

## 2020-03-30 ENCOUNTER — Ambulatory Visit: Payer: Medicare (Managed Care) | Attending: Physician Assistant

## 2020-03-30 DIAGNOSIS — S80821A Blister (nonthermal), right lower leg, initial encounter: Secondary | ICD-10-CM | POA: Insufficient documentation

## 2020-03-30 DIAGNOSIS — Y834 Other reconstructive surgery as the cause of abnormal reaction of the patient, or of later complication, without mention of misadventure at the time of the procedure: Secondary | ICD-10-CM

## 2020-03-30 DIAGNOSIS — L98412 Non-pressure chronic ulcer of buttock with fat layer exposed: Secondary | ICD-10-CM | POA: Insufficient documentation

## 2020-03-30 DIAGNOSIS — G609 Hereditary and idiopathic neuropathy, unspecified: Secondary | ICD-10-CM | POA: Insufficient documentation

## 2020-03-30 DIAGNOSIS — G809 Cerebral palsy, unspecified: Secondary | ICD-10-CM | POA: Insufficient documentation

## 2020-03-30 DIAGNOSIS — Y848 Other medical procedures as the cause of abnormal reaction of the patient, or of later complication, without mention of misadventure at the time of the procedure: Secondary | ICD-10-CM | POA: Insufficient documentation

## 2020-04-06 ENCOUNTER — Ambulatory Visit: Payer: Medicare (Managed Care) | Attending: Physician Assistant

## 2020-04-06 DIAGNOSIS — Y848 Other medical procedures as the cause of abnormal reaction of the patient, or of later complication, without mention of misadventure at the time of the procedure: Secondary | ICD-10-CM

## 2020-04-06 DIAGNOSIS — L98412 Non-pressure chronic ulcer of buttock with fat layer exposed: Secondary | ICD-10-CM

## 2020-04-06 DIAGNOSIS — S80821A Blister (nonthermal), right lower leg, initial encounter: Secondary | ICD-10-CM

## 2020-04-06 DIAGNOSIS — Y834 Other reconstructive surgery as the cause of abnormal reaction of the patient, or of later complication, without mention of misadventure at the time of the procedure: Secondary | ICD-10-CM

## 2020-04-20 ENCOUNTER — Ambulatory Visit: Payer: Medicare (Managed Care) | Attending: Physician Assistant

## 2020-04-20 DIAGNOSIS — L98412 Non-pressure chronic ulcer of buttock with fat layer exposed: Secondary | ICD-10-CM

## 2020-04-20 DIAGNOSIS — Y834 Other reconstructive surgery as the cause of abnormal reaction of the patient, or of later complication, without mention of misadventure at the time of the procedure: Secondary | ICD-10-CM

## 2020-04-20 DIAGNOSIS — Y848 Other medical procedures as the cause of abnormal reaction of the patient, or of later complication, without mention of misadventure at the time of the procedure: Secondary | ICD-10-CM

## 2020-04-20 DIAGNOSIS — L97812 Non-pressure chronic ulcer of other part of right lower leg with fat layer exposed: Secondary | ICD-10-CM

## 2020-04-27 ENCOUNTER — Ambulatory Visit: Payer: Medicare (Managed Care) | Attending: Physician Assistant

## 2020-04-27 DIAGNOSIS — Z87440 Personal history of urinary (tract) infections: Secondary | ICD-10-CM | POA: Insufficient documentation

## 2020-04-27 DIAGNOSIS — L97812 Non-pressure chronic ulcer of other part of right lower leg with fat layer exposed: Secondary | ICD-10-CM | POA: Insufficient documentation

## 2020-04-27 DIAGNOSIS — G609 Hereditary and idiopathic neuropathy, unspecified: Secondary | ICD-10-CM | POA: Insufficient documentation

## 2020-04-27 DIAGNOSIS — L98418 Non-pressure chronic ulcer of buttock with other specified severity: Secondary | ICD-10-CM | POA: Insufficient documentation

## 2020-04-27 DIAGNOSIS — G809 Cerebral palsy, unspecified: Secondary | ICD-10-CM | POA: Insufficient documentation

## 2020-05-18 ENCOUNTER — Ambulatory Visit: Payer: Medicare (Managed Care) | Attending: Physician Assistant

## 2020-05-18 DIAGNOSIS — M1990 Unspecified osteoarthritis, unspecified site: Secondary | ICD-10-CM | POA: Insufficient documentation

## 2020-05-18 DIAGNOSIS — Y848 Other medical procedures as the cause of abnormal reaction of the patient, or of later complication, without mention of misadventure at the time of the procedure: Secondary | ICD-10-CM

## 2020-05-18 DIAGNOSIS — G609 Hereditary and idiopathic neuropathy, unspecified: Secondary | ICD-10-CM | POA: Insufficient documentation

## 2020-05-18 DIAGNOSIS — L97812 Non-pressure chronic ulcer of other part of right lower leg with fat layer exposed: Secondary | ICD-10-CM | POA: Insufficient documentation

## 2020-05-18 DIAGNOSIS — G808 Other cerebral palsy: Secondary | ICD-10-CM | POA: Insufficient documentation

## 2020-05-18 DIAGNOSIS — L98419 Non-pressure chronic ulcer of buttock with unspecified severity: Secondary | ICD-10-CM | POA: Insufficient documentation

## 2020-05-18 DIAGNOSIS — Z87440 Personal history of urinary (tract) infections: Secondary | ICD-10-CM | POA: Insufficient documentation

## 2020-05-18 DIAGNOSIS — L98411 Non-pressure chronic ulcer of buttock limited to breakdown of skin: Secondary | ICD-10-CM

## 2020-05-18 DIAGNOSIS — Y834 Other reconstructive surgery as the cause of abnormal reaction of the patient, or of later complication, without mention of misadventure at the time of the procedure: Secondary | ICD-10-CM

## 2020-07-27 ENCOUNTER — Ambulatory Visit: Payer: Medicare Managed Care Other | Attending: Physician Assistant

## 2020-07-27 DIAGNOSIS — L98411 Non-pressure chronic ulcer of buttock limited to breakdown of skin: Secondary | ICD-10-CM

## 2020-07-27 DIAGNOSIS — L97812 Non-pressure chronic ulcer of other part of right lower leg with fat layer exposed: Secondary | ICD-10-CM

## 2020-07-27 DIAGNOSIS — Y834 Other reconstructive surgery as the cause of abnormal reaction of the patient, or of later complication, without mention of misadventure at the time of the procedure: Secondary | ICD-10-CM

## 2020-07-27 DIAGNOSIS — Y848 Other medical procedures as the cause of abnormal reaction of the patient, or of later complication, without mention of misadventure at the time of the procedure: Secondary | ICD-10-CM
